# Patient Record
Sex: Female | Born: 1990
Health system: Southern US, Community
[De-identification: ages and names within clinical notes are randomized; demographics above are authoritative.]

## PROBLEM LIST (undated history)

## (undated) DIAGNOSIS — R569 Unspecified convulsions: Secondary | ICD-10-CM

## (undated) DIAGNOSIS — E039 Hypothyroidism, unspecified: Secondary | ICD-10-CM

## (undated) DIAGNOSIS — D271 Benign neoplasm of left ovary: Secondary | ICD-10-CM

## (undated) DIAGNOSIS — R51 Headache: Secondary | ICD-10-CM

---

## 2014-09-24 LAB — OB RESULTS CONSOLE ANTIBODY SCREEN: ANTIBODY SCREEN: NEGATIVE

## 2014-09-24 LAB — OB RESULTS CONSOLE ABO/RH: RH TYPE: POSITIVE

## 2014-09-24 LAB — OB RESULTS CONSOLE RUBELLA ANTIBODY, IGM: RUBELLA: IMMUNE

## 2014-09-24 LAB — OB RESULTS CONSOLE HIV ANTIBODY (ROUTINE TESTING): HIV: NONREACTIVE

## 2014-09-24 LAB — OB RESULTS CONSOLE HEPATITIS B SURFACE ANTIGEN: Hepatitis B Surface Ag: NEGATIVE

## 2014-09-24 LAB — OB RESULTS CONSOLE RPR: RPR: NONREACTIVE

## 2014-09-24 LAB — OB RESULTS CONSOLE GC/CHLAMYDIA
CHLAMYDIA, DNA PROBE: NEGATIVE
Gonorrhea: NEGATIVE

## 2014-11-28 NOTE — L&D Delivery Note (Signed)
Delivery Note At 11:14 PM a viable and healthy female was delivered via Vaginal, Spontaneous Delivery (Presentation: Right Occiput Anterior).  APGAR: ,9 and 9 ; weight  .-pending Placenta status: complete, spontaneous, .  Cord: 3 vessels with the following complications: None.  Cord pH: n/a  Anesthesia: Epidural  Episiotomy: None Lacerations: 2nd degree;Perineal Suture Repair: 3.0 vicryl rapide Est. Blood Loss (mL):  350 cc  Mom to postpartum.  Baby to Couplet care / Skin to Skin.  Neelam Tiggs R 04/13/2015, 11:37 PM

## 2014-12-13 ENCOUNTER — Inpatient Hospital Stay (HOSPITAL_COMMUNITY): Admission: AD | Admit: 2014-12-13 | Payer: Self-pay | Source: Ambulatory Visit | Admitting: Obstetrics & Gynecology

## 2015-04-13 ENCOUNTER — Encounter (HOSPITAL_COMMUNITY): Payer: Self-pay | Admitting: *Deleted

## 2015-04-13 ENCOUNTER — Inpatient Hospital Stay (HOSPITAL_COMMUNITY)
Admission: AD | Admit: 2015-04-13 | Discharge: 2015-04-15 | DRG: 775 | Disposition: A | Discharge status: Home / Self Care | Payer: 59 | Source: Ambulatory Visit | Attending: Obstetrics & Gynecology | Admitting: Obstetrics & Gynecology

## 2015-04-13 ENCOUNTER — Inpatient Hospital Stay (HOSPITAL_COMMUNITY): Payer: 59 | Admitting: Anesthesiology

## 2015-04-13 DIAGNOSIS — Z3A37 37 weeks gestation of pregnancy: Secondary | ICD-10-CM | POA: Diagnosis present

## 2015-04-13 DIAGNOSIS — O99824 Streptococcus B carrier state complicating childbirth: Secondary | ICD-10-CM | POA: Diagnosis present

## 2015-04-13 DIAGNOSIS — IMO0001 Reserved for inherently not codable concepts without codable children: Secondary | ICD-10-CM

## 2015-04-13 DIAGNOSIS — Z3403 Encounter for supervision of normal first pregnancy, third trimester: Secondary | ICD-10-CM | POA: Diagnosis present

## 2015-04-13 HISTORY — DX: Unspecified convulsions: R56.9

## 2015-04-13 LAB — ABO/RH: ABO/RH(D): O POS

## 2015-04-13 LAB — CBC
HCT: 38.7 % (ref 36.0–46.0)
Hemoglobin: 13.4 g/dL (ref 12.0–15.0)
MCH: 31.2 pg (ref 26.0–34.0)
MCHC: 34.6 g/dL (ref 30.0–36.0)
MCV: 90 fL (ref 78.0–100.0)
PLATELETS: 201 10*3/uL (ref 150–400)
RBC: 4.3 MIL/uL (ref 3.87–5.11)
RDW: 13.7 % (ref 11.5–15.5)
WBC: 13.3 10*3/uL — ABNORMAL HIGH (ref 4.0–10.5)

## 2015-04-13 LAB — OB RESULTS CONSOLE GBS: GBS: POSITIVE

## 2015-04-13 LAB — TYPE AND SCREEN
ABO/RH(D): O POS
ANTIBODY SCREEN: NEGATIVE

## 2015-04-13 MED ORDER — OXYCODONE-ACETAMINOPHEN 5-325 MG PO TABS
2.0000 | ORAL_TABLET | ORAL | Status: DC | PRN
Start: 2015-04-13 — End: 2015-04-14

## 2015-04-13 MED ORDER — OXYTOCIN 40 UNITS IN LACTATED RINGERS INFUSION - SIMPLE MED
62.5000 mL/h | INTRAVENOUS | Status: DC
  Administered 2015-04-13: 62.5 mL/h via INTRAVENOUS
  Filled 2015-04-13: qty 1000

## 2015-04-13 MED ORDER — ACETAMINOPHEN 325 MG PO TABS
650.0000 mg | ORAL_TABLET | ORAL | Status: DC | PRN
Start: 2015-04-13 — End: 2015-04-14

## 2015-04-13 MED ORDER — LIDOCAINE HCL (PF) 1 % IJ SOLN
30.0000 mL | INTRAMUSCULAR | Status: DC | PRN
  Filled 2015-04-13: qty 30

## 2015-04-13 MED ORDER — ONDANSETRON HCL 4 MG/2ML IJ SOLN: 4.0000 mg | Freq: Four times a day (QID) | INTRAMUSCULAR | Status: DC | PRN

## 2015-04-13 MED ORDER — LIDOCAINE-EPINEPHRINE (PF) 2 %-1:200000 IJ SOLN
INTRAMUSCULAR | Status: DC | PRN
  Administered 2015-04-13: 3 mL

## 2015-04-13 MED ORDER — LACTATED RINGERS IV SOLN: 500.0000 mL | INTRAVENOUS | Status: DC | PRN

## 2015-04-13 MED ORDER — CITRIC ACID-SODIUM CITRATE 334-500 MG/5ML PO SOLN: 30.0000 mL | ORAL | Status: DC | PRN

## 2015-04-13 MED ORDER — DIPHENHYDRAMINE HCL 50 MG/ML IJ SOLN: 12.5000 mg | INTRAMUSCULAR | Status: DC | PRN

## 2015-04-13 MED ORDER — FENTANYL 2.5 MCG/ML BUPIVACAINE 1/10 % EPIDURAL INFUSION (WH - ANES)
14.0000 mL/h | INTRAMUSCULAR | Status: DC | PRN
  Administered 2015-04-13: 10 mL/h via EPIDURAL
  Filled 2015-04-13: qty 125

## 2015-04-13 MED ORDER — EPHEDRINE 5 MG/ML INJ
10.0000 mg | INTRAVENOUS | Status: DC | PRN
  Filled 2015-04-13: qty 2

## 2015-04-13 MED ORDER — SODIUM BICARBONATE 8.4 % IV SOLN
INTRAVENOUS | Status: DC | PRN
  Administered 2015-04-13 (×2): 4 mL via EPIDURAL

## 2015-04-13 MED ORDER — OXYCODONE-ACETAMINOPHEN 5-325 MG PO TABS
1.0000 | ORAL_TABLET | ORAL | Status: DC | PRN
  Administered 2015-04-14: 1 via ORAL
  Filled 2015-04-13: qty 1

## 2015-04-13 MED ORDER — OXYTOCIN BOLUS FROM INFUSION: 500.0000 mL | INTRAVENOUS | Status: DC

## 2015-04-13 MED ORDER — SODIUM CHLORIDE 0.9 % IV SOLN
2.0000 g | Freq: Once | INTRAVENOUS | Status: AC
  Administered 2015-04-13: 2 g via INTRAVENOUS
  Filled 2015-04-13: qty 2000

## 2015-04-13 MED ORDER — LACTATED RINGERS IV SOLN
INTRAVENOUS | Status: DC
  Administered 2015-04-13: 19:00:00 via INTRAVENOUS

## 2015-04-13 MED ORDER — PHENYLEPHRINE 40 MCG/ML (10ML) SYRINGE FOR IV PUSH (FOR BLOOD PRESSURE SUPPORT)
80.0000 ug | PREFILLED_SYRINGE | INTRAVENOUS | Status: DC | PRN
  Filled 2015-04-13: qty 2
  Filled 2015-04-13: qty 20

## 2015-04-13 NOTE — H&P (Addendum)
Robin Griffin is a 24 y.o. female presenting at 55 wks in active labor. UCs with pain since 5 pm but noted slight blood spotty discharge this morning. Exam closed/long cervix 2 days back in office. Denies bleeding/ loss of fluid. Good FMs.   G1, well dated by 1st trim sono. [facility]/ Dr Benjie Karvonen.  Healthy, h/o childhood seizures but no medication since years. SCH'hage in 1st trim 5.5 cm, resolved. GBS(+), rest was uncomplicated pregnancy. Last sono 2 days back- Vtx, 6'8" at 67%, nl AFI.   History OB History    Gravida Para Term Preterm AB TAB SAB Ectopic Multiple Living   1              Past Medical History  Diagnosis Date  . Seizures    History reviewed. No pertinent past surgical history. Family History: family history is not on file. Social History:  has no tobacco, alcohol, and drug history on file.   Prenatal Transfer Tool  Maternal Diabetes: No Genetic Screening: Normal Ultrascreen normal (1:10,000) and AFP1 normal.  Maternal Ultrasounds/Referrals: Normal anatomy. 1st trim subchorionic hemorrhage resolved at 14 wks.  Fetal Ultrasounds or other Referrals:  None Maternal Substance Abuse:  No Significant Maternal Medications:  Zantac Significant Maternal Lab Results:  Lab values include: Group B Strep positive Other Comments:  None  ROS neg   Dilation: 10 Effacement (%): 100 Station: +1, +2 Exam by:: Caerpenter RNC Blood pressure 125/84, pulse 98, temperature 98.2 F (36.8 C), temperature source Oral, resp. rate 18, height 5\' 3"  (1.6 m), weight 131 lb (59.421 kg), SpO2 99 %. Exam Physical Exam  A&O x 3, no acute distress. Pleasant HEENT neg, no thyromegaly Lungs CTA bilat CV RRR, S1S2 normal Abdo soft, non tender, non acute Extr no edema/ tenderness Pelvic see above per RN FHT  140s, + accels/ no decels/ mod variability- category I Toco regular 3 minutes.   Prenatal labs: ABO, Rh: O/Positive/-- (10/28 0000) Antibody: Negative (10/28 0000) Rubella:  Immune (10/28 0000) RPR: Nonreactive (10/28 0000)  HBsAg: Negative (10/28 0000)  HIV: Non-reactive (10/28 0000)  GBS: Positive (05/16 0000)   Assessment/Plan: 24 yo, G1 at 37 wks in active labor. GBS(+), Ampicillin started in MAU. FHT- category I. Anticipate SVD (6.1/2 lbs), desired epidural, ok if not delivered.   Evonda Enge R 04/13/2015, 7:42 PM   Addendum-  Possible SROM since 9 am on 04/12/15. V.Abbeygail Igoe, MD

## 2015-04-13 NOTE — MAU Note (Signed)
Ctx at 2am . Thinks she had some bleeding and leaking of fluid

## 2015-04-13 NOTE — Anesthesia Procedure Notes (Signed)
Epidural Patient location during procedure: OB  Staffing Anesthesiologist: Avanti Jetter, CHRIS Performed by: anesthesiologist   Preanesthetic Checklist Completed: patient identified, surgical consent, pre-op evaluation, timeout performed, IV checked, risks and benefits discussed and monitors and equipment checked  Epidural Patient position: sitting Prep: site prepped and draped and DuraPrep Patient monitoring: heart rate, cardiac monitor, continuous pulse ox and blood pressure Approach: midline Location: L3-L4 Injection technique: LOR saline  Needle:  Needle type: Tuohy  Needle gauge: 17 G Needle length: 9 cm Needle insertion depth: 4 cm Catheter type: closed end flexible Catheter size: 19 Gauge Catheter at skin depth: 12 cm Test dose: negative and 2% lidocaine with Epi 1:200 K  Assessment Events: blood not aspirated, injection not painful, no injection resistance, negative IV test and no paresthesia  Additional Notes H+P and labs checked, risks and benefits discussed with the patient, consent obtained, procedure tolerated well and without complications.  Reason for block:procedure for pain

## 2015-04-13 NOTE — Anesthesia Preprocedure Evaluation (Signed)
Anesthesia Evaluation  Patient identified by MRN, date of birth, ID band Patient awake    Reviewed: Allergy & Precautions, NPO status , Patient's Chart, lab work & pertinent test results  History of Anesthesia Complications Negative for: history of anesthetic complications  Airway Mallampati: I  TM Distance: >3 FB Neck ROM: Full    Dental  (+) Teeth Intact   Pulmonary neg pulmonary ROS,  breath sounds clear to auscultation        Cardiovascular Rhythm:Regular     Neuro/Psych negative neurological ROS     GI/Hepatic negative GI ROS, Neg liver ROS,   Endo/Other  negative endocrine ROS  Renal/GU negative Renal ROS     Musculoskeletal   Abdominal   Peds  Hematology negative hematology ROS (+)   Anesthesia Other Findings   Reproductive/Obstetrics (+) Pregnancy                             Anesthesia Physical Anesthesia Plan  ASA: II  Anesthesia Plan: Epidural   Post-op Pain Management:    Induction:   Airway Management Planned:   Additional Equipment:   Intra-op Plan:   Post-operative Plan:   Informed Consent: I have reviewed the patients History and Physical, chart, labs and discussed the procedure including the risks, benefits and alternatives for the proposed anesthesia with the patient or authorized representative who has indicated his/her understanding and acceptance.     Plan Discussed with: Anesthesiologist  Anesthesia Plan Comments:         Anesthesia Quick Evaluation

## 2015-04-14 ENCOUNTER — Encounter (HOSPITAL_COMMUNITY): Payer: Self-pay | Admitting: *Deleted

## 2015-04-14 LAB — CBC
HCT: 35.9 % — ABNORMAL LOW (ref 36.0–46.0)
Hemoglobin: 12.3 g/dL (ref 12.0–15.0)
MCH: 30.6 pg (ref 26.0–34.0)
MCHC: 34.3 g/dL (ref 30.0–36.0)
MCV: 89.3 fL (ref 78.0–100.0)
Platelets: 216 10*3/uL (ref 150–400)
RBC: 4.02 MIL/uL (ref 3.87–5.11)
RDW: 13.5 % (ref 11.5–15.5)
WBC: 19.1 10*3/uL — ABNORMAL HIGH (ref 4.0–10.5)

## 2015-04-14 LAB — HIV ANTIBODY (ROUTINE TESTING W REFLEX): HIV Screen 4th Generation wRfx: NONREACTIVE

## 2015-04-14 LAB — RPR: RPR Ser Ql: NONREACTIVE

## 2015-04-14 MED ORDER — LANOLIN HYDROUS EX OINT: TOPICAL_OINTMENT | CUTANEOUS | Status: DC | PRN

## 2015-04-14 MED ORDER — BENZOCAINE-MENTHOL 20-0.5 % EX AERO
1.0000 "application " | INHALATION_SPRAY | CUTANEOUS | Status: DC | PRN
  Administered 2015-04-14: 1 via TOPICAL
  Filled 2015-04-14: qty 56

## 2015-04-14 MED ORDER — TETANUS-DIPHTH-ACELL PERTUSSIS 5-2.5-18.5 LF-MCG/0.5 IM SUSP: 0.5000 mL | Freq: Once | INTRAMUSCULAR | Status: DC

## 2015-04-14 MED ORDER — ONDANSETRON HCL 4 MG PO TABS: 4.0000 mg | ORAL_TABLET | ORAL | Status: DC | PRN

## 2015-04-14 MED ORDER — ONDANSETRON HCL 4 MG/2ML IJ SOLN: 4.0000 mg | INTRAMUSCULAR | Status: DC | PRN

## 2015-04-14 MED ORDER — ACETAMINOPHEN 325 MG PO TABS: 650.0000 mg | ORAL_TABLET | ORAL | Status: DC | PRN

## 2015-04-14 MED ORDER — DIPHENHYDRAMINE HCL 25 MG PO CAPS: 25.0000 mg | ORAL_CAPSULE | Freq: Four times a day (QID) | ORAL | Status: DC | PRN

## 2015-04-14 MED ORDER — SENNOSIDES-DOCUSATE SODIUM 8.6-50 MG PO TABS
2.0000 | ORAL_TABLET | ORAL | Status: DC
  Administered 2015-04-14 (×2): 2 via ORAL
  Filled 2015-04-14 (×2): qty 2

## 2015-04-14 MED ORDER — WITCH HAZEL-GLYCERIN EX PADS
1.0000 "application " | MEDICATED_PAD | CUTANEOUS | Status: DC | PRN
  Administered 2015-04-14: 1 via TOPICAL

## 2015-04-14 MED ORDER — DIBUCAINE 1 % RE OINT
1.0000 "application " | TOPICAL_OINTMENT | RECTAL | Status: DC | PRN
  Administered 2015-04-14: 1 via RECTAL
  Filled 2015-04-14: qty 28

## 2015-04-14 MED ORDER — PRENATAL MULTIVITAMIN CH
1.0000 | ORAL_TABLET | Freq: Every day | ORAL | Status: DC
  Administered 2015-04-14 – 2015-04-15 (×2): 1 via ORAL
  Filled 2015-04-14 (×2): qty 1

## 2015-04-14 MED ORDER — ZOLPIDEM TARTRATE 5 MG PO TABS: 5.0000 mg | ORAL_TABLET | Freq: Every evening | ORAL | Status: DC | PRN

## 2015-04-14 MED ORDER — OXYCODONE-ACETAMINOPHEN 5-325 MG PO TABS
1.0000 | ORAL_TABLET | ORAL | Status: DC | PRN
  Administered 2015-04-14: 1 via ORAL
  Filled 2015-04-14: qty 1

## 2015-04-14 MED ORDER — SIMETHICONE 80 MG PO CHEW: 80.0000 mg | CHEWABLE_TABLET | ORAL | Status: DC | PRN

## 2015-04-14 MED ORDER — IBUPROFEN 600 MG PO TABS
600.0000 mg | ORAL_TABLET | Freq: Four times a day (QID) | ORAL | Status: DC
  Administered 2015-04-14 – 2015-04-15 (×6): 600 mg via ORAL
  Filled 2015-04-14 (×6): qty 1

## 2015-04-14 NOTE — Progress Notes (Signed)
PPD 1 SVD  S:  Reports feeling ok but a little tired             Tolerating po/ No nausea or vomiting             Bleeding is light             Pain controlled with motrin             Up ad lib / ambulatory / voiding QS  Newborn bottle-feeding / may also pump some for breast-feeding  / female  O:               VS: BP 113/75 mmHg  Pulse 105  Temp(Src) 98 F (36.7 C) (Oral)  Resp 20  Ht 5\' 3"  (1.6 m)  Wt 59.421 kg (131 lb)  BMI 23.21 kg/m2  SpO2 99%  Breastfeeding? Unknown   LABS:              Recent Labs  04/13/15 1855 04/14/15 0550  WBC 13.3* 19.1*  HGB 13.4 12.3  PLT 201 216               Blood type: --/--/O POS, O POS (05/16 1855)  Rubella: Immune (10/28 0000)                   tdap current               I&O: Intake/Output      05/16 0701 - 05/17 0700 05/17 0701 - 05/18 0700   Blood 350    Total Output 350     Net -350                      Physical Exam:             Alert and oriented X3  Abdomen: soft, non-tender, non-distended              Fundus: firm, non-tender, U-1  Perineum: mild edema / ice pack in place  Lochia: moderate  Extremities: no edema, no calf pain or tenderness   A: PPD # 1 SVD with 2nd degree repair  Doing well - stable status  P: Routine post partum orders  Anticipate DC home tomorrow             Encouraged to work with lactation today if desires to breastfeed / discus feeding plan with pediatrician this am on rounds - agrees  Artelia Laroche CNM, MSN, [facility] 04/14/2015, 8:42 AM

## 2015-04-14 NOTE — Anesthesia Postprocedure Evaluation (Signed)
Anesthesia Post Note  Patient: Robin Griffin  Procedure(s) Performed: * No procedures listed *  Anesthesia type: Epidural  Patient location: Mother/Baby  Post pain: Pain level controlled  Post assessment: Post-op Vital signs reviewed  Last Vitals:  Filed Vitals:   04/14/15 0900  BP: 97/61  Pulse: 99  Temp: 36.7 C  Resp: 16    Post vital signs: Reviewed  Level of consciousness:alert  Complications: No apparent anesthesia complications

## 2015-04-14 NOTE — Lactation Note (Signed)
This note was copied from the chart of Robin Alexah Kivett. Lactation Consultation Note  P1, 37 weeks.  Baby sleepy at the breast.  Has not sustained a latch. Attempted latching in all positions.  Mother screamed out in pain with shallow latch. Encouraged baby to open wider to achieve a deeper latch.  Introduced #20NS. Mother's pain is not improved with NS. Reviewed hand expression, prepumping before latching. Mother had the most success with side lying, a few sucks noted but no swallows. Plan is for baby to breastfeed if possible then mother will post pump 4-6 times a day for 15-20 min. Give baby back volume pumped.  Reviewed milk storage, cleaning and how to use 5 fr with syringe for feedings. Assisted with FOB to feed baby 7 ml of hydrolyzed formula. Reviewed burping, cluster feeding and waking baby to feed every 3 hours. Mom made aware of O/P services, breastfeeding support groups, community resources, and our phone # for post-discharge questions.    Patient Name: Robin Griffin [id number] Date: 04/14/2015 Reason for consult: Initial assessment   Maternal Data    Feeding Feeding Type: Breast Fed Length of feed: 10 min (off and on)  LATCH Score/Interventions Latch: Repeated attempts needed to sustain latch, nipple held in mouth throughout feeding, stimulation needed to elicit sucking reflex. Intervention(s): Skin to skin;Waking techniques Intervention(s): Adjust position;Assist with latch;Breast massage  Audible Swallowing: None Intervention(s): Skin to skin;Hand expression  Type of Nipple: Everted at rest and after stimulation  Comfort (Breast/Nipple): Filling, red/small blisters or bruises, mild/mod discomfort  Problem noted: Mild/Moderate discomfort Interventions (Mild/moderate discomfort): Hand massage;Hand expression;Post-pump  Hold (Positioning): Assistance needed to correctly position infant at breast and maintain latch.  LATCH Score: 5  Lactation Tools  Discussed/Used     Consult Status Consult Status: Follow-up Date: 04/15/15 Follow-up type: In-patient    Vivianne Master [facility] 04/14/2015, 2:31 PM

## 2015-04-15 LAB — CBC
HCT: 32.6 % — ABNORMAL LOW (ref 36.0–46.0)
Hemoglobin: 11 g/dL — ABNORMAL LOW (ref 12.0–15.0)
MCH: 30.6 pg (ref 26.0–34.0)
MCHC: 33.7 g/dL (ref 30.0–36.0)
MCV: 90.8 fL (ref 78.0–100.0)
PLATELETS: 177 10*3/uL (ref 150–400)
RBC: 3.59 MIL/uL — AB (ref 3.87–5.11)
RDW: 13.9 % (ref 11.5–15.5)
WBC: 12.4 10*3/uL — ABNORMAL HIGH (ref 4.0–10.5)

## 2015-04-15 MED ORDER — OXYCODONE-ACETAMINOPHEN 5-325 MG PO TABS: 1.0000 | ORAL_TABLET | ORAL | Status: DC | PRN

## 2015-04-15 MED ORDER — IBUPROFEN 600 MG PO TABS: 600.0000 mg | ORAL_TABLET | Freq: Four times a day (QID) | ORAL | Status: DC

## 2015-04-15 NOTE — Lactation Note (Signed)
This note was copied from the chart of Robin Karishma Unrein. Lactation Consultation Note  38 week old baby. Baby being put on double phototherapy. Observed mother attempting to latch in football hold but baby too sleepy. Mother states during the night baby was more active at the breast. After 10 min of nuzzling, suggest FOB give baby formula supplementation with 5 fr and finger. Baby took 12 ml. Mother has not been pumped today. Mother massaged and hand expressed a few small drops. Assisted with DEBP encouraged her to pump at least 3 more times today to help establish her milk supply. Parents aware of volume guidelines. Suggest outpt appt after discharge.  FOB will check his schedule.  Discussed possible renting a DEBP. Parents have breast pump prescription.  Encouraged them to call their insurance company.   Patient Name: Robin Griffin [id number] Date: 04/15/2015 Reason for consult: Follow-up assessment   Maternal Data    Feeding Feeding Type: Formula  LATCH Score/Interventions Latch: Too sleepy or reluctant, no latch achieved, no sucking elicited. Intervention(s): Waking techniques;Skin to skin Intervention(s): Breast massage;Assist with latch;Adjust position  Audible Swallowing: None  Type of Nipple: Everted at rest and after stimulation  Comfort (Breast/Nipple): Filling, red/small blisters or bruises, mild/mod discomfort  Problem noted: Mild/Moderate discomfort Interventions (Mild/moderate discomfort): Hand expression;Comfort gels;Post-pump  Hold (Positioning): No assistance needed to correctly position infant at breast.  LATCH Score: 5  Lactation Tools Discussed/Used Pump Review: Setup, frequency, and cleaning;Milk Storage Initiated by:: Vivianne Master RN Date initiated:: 04/14/15   Consult Status      Robin Griffin Robin Griffin 04/15/2015, 12:37 PM

## 2015-04-15 NOTE — Discharge Summary (Signed)
Obstetric Discharge Summary Reason for Admission: onset of labor and rupture of membranes Prenatal Procedures: ultrasound Intrapartum Procedures: spontaneous vaginal delivery Postpartum Procedures: none Complications-Operative and Postpartum: 2nd degree perineal laceration HEMOGLOBIN  Date Value Ref Range Status  04/15/2015 11.0* 12.0 - 15.0 g/dL Final   HCT  Date Value Ref Range Status  04/15/2015 32.6* 36.0 - 46.0 % Final    Physical Exam:  General: alert, cooperative and no distress Lochia: appropriate Uterine Fundus: firm, midline, U-1 Perineum: healing well, no significant drainage, no dehiscence, no significant erythema DVT Evaluation: No evidence of DVT seen on physical exam. Negative Homan's sign. No cords or calf tenderness. No significant calf/ankle edema.  Discharge Diagnoses: Term Pregnancy-delivered  Discharge Information: Date: 04/15/2015 Activity: pelvic rest Diet: routine Medications: PNV and Ibuprofen Condition: stable Instructions: refer to practice specific booklet Discharge to: home Follow-up Information    Follow up with MODY,VAISHALI R, MD. Schedule an appointment as soon as possible for a visit in 6 weeks.   Specialty:  Obstetrics and Gynecology   Why:  postpartum visit   Contact information:   [place] [address] [phone]       Newborn Data: Live born female on 04/13/2017 Birth Weight: 6 lb 10.2 oz (3010 g) APGAR: 8, 9  Home with mother.  Laury Deep, M MSN, CNM 04/15/2015, 12:45 PM

## 2015-04-15 NOTE — Discharge Instructions (Signed)
Breast Pumping Tips °If you are breastfeeding, there may be times when you cannot feed your baby directly. Returning to work or going on a trip are common examples. Pumping allows you to store breast milk and feed it to your baby later.  °You may not get much milk when you first start to pump. Your breasts should start to make more after a few days. If you pump at the times you usually feed your baby, you may be able to keep making enough milk to feed your baby without also using formula. The more often you pump, the more milk you will produce.  °WHEN SHOULD I PUMP?  °· You can begin to pump soon after delivery. However, some experts recommend waiting about 4 weeks before giving your infant a bottle to make sure breastfeeding is going well.  °· If you plan to return to work, begin pumping a few weeks before. This will help you develop techniques that work best for you. It also lets you build up a supply of breast milk.   °· When you are with your infant, feed on demand and pump after each feeding.   °· When you are away from your infant for several hours, pump for about 15 minutes every 2-3 hours. Pump both breasts at the same time if you can.   °· If your infant has a formula feeding, make sure to pump around the same time.     °· If you drink any alcohol, wait 2 hours before pumping.   °HOW DO I PREPARE TO PUMP? °Your let-down reflex is the natural reaction to stimulation that makes your breast milk flow. It is easier to stimulate this reflex when you are relaxed. Find relaxation techniques that work for you. If you have difficulty with your let-down reflex, try these methods:  °· Smell one of your infant's blankets or an item of clothing.   °· Look at a picture or video of your infant.   °· Sit in a quiet, private space.   °· Massage the breast you plan to pump.   °· Place soothing warmth on the breast.   °· Play relaxing music.   °WHAT ARE SOME GENERAL BREAST PUMPING TIPS? °· Wash your hands before you pump. You  do not need to wash your nipples or breasts. °· There are three ways to pump. °¨ You can use your hand to massage and compress your breast. °¨ You can use a handheld manual pump. °¨ You can use an electric pump.   °· Make sure the suction cup (flange) on the breast pump is the right size. Place the flange directly over the nipple. If it is the wrong size or placed the wrong way, it may be painful and cause nipple damage.   °· If pumping is uncomfortable, apply a small amount of purified or modified lanolin to your nipple and areola. °· If you are using an electric pump, adjust the speed and suction power to be more comfortable. °· If pumping is painful or if you are not getting very much milk, you may need a different type of pump. A lactation consultant can help you determine what type of pump to use.   °· Keep a full water bottle near you at all times. Drinking lots of fluid helps you make more milk.  °· You can store your milk to use later. Pumped breast milk can be stored in a sealable, sterile container or plastic bag. Label all stored breast milk with the date you pumped it. °¨ Milk can stay out at room temperature for up to 8 hours. °¨   You can store your milk in the refrigerator for up to 8 days. °¨ You can store your milk in the freezer for 3 months. Thaw frozen milk using warm water. Do not put it in the microwave. °· Do not smoke. Smoking can lower your milk supply and harm your infant. If you need help quitting, ask your health care provider to recommend a program.   °WHEN SHOULD I CALL MY HEALTH CARE PROVIDER OR A LACTATION CONSULTANT? °· You are having trouble pumping. °· You are concerned that you are not making enough milk. °· You have nipple pain, soreness, or redness. °· You want to use birth control. Birth control pills may lower your milk supply. Talk to your health care provider about your options. °Document Released: 05/04/2010 Document Revised: 11/19/2013 Document Reviewed:  09/06/2013 °[employer]® Patient Information ©2015 [employer]. This information is not intended to replace advice given to you by your health care provider. Make sure you discuss any questions you have with your health care provider. ° °Nutrition for the New Mother  °A new mother needs good health and nutrition so she can have energy to take care of a new baby. Whether a mother breastfeeds or formula feeds the baby, it is important to have a well-balanced diet. Foods from all the food groups should be chosen to meet the new mother's energy needs and to give her the nutrients needed for repair and healing.  °A HEALTHY EATING PLAN °The My Pyramid plan for Moms outlines what you should eat to help you and your baby stay healthy. The energy and amount of food you need depends on whether or not you are breastfeeding. If you are breastfeeding you will need more nutrients. If you choose not to breastfeed, your nutrition goal should be to return to a healthy weight. Limiting calories may be needed if you are not breastfeeding.  °HOME CARE INSTRUCTIONS  °· For a personal plan based on your unique needs, see your Registered Dietitian or visit www.mypyramid.gov. °· Eat a variety of foods. The plan below will help guide you. The following chart has a suggested daily meal plan from the My Pyramid for Moms. °· Eat a variety of fruits and vegetables. °· Eat more dark green and orange vegetables and cooked dried beans. °· Make half your grains whole grains. Choose whole instead of refined grains. °· Choose low-fat or lean meats and poultry. °· Choose low-fat or fat-free dairy products like milk, cheese, or yogurt. °Fruits °· Breastfeeding: 2 cups °· Non-Breastfeeding: 2 cups °· What Counts as a serving? °¨ 1 cup of fruit or juice. °¨ ½ cup dried fruit. °Vegetables °· Breastfeeding: 3 cups °· Non-Breastfeeding: 2 ½ cups °· What Counts as a serving? °¨ 1 cup raw or cooked vegetables. °¨ Juice or 2 cups raw leafy  vegetables. °Grains °· Breastfeeding: 8 oz °· Non-Breastfeeding: 6 oz °· What Counts as a serving? °¨ 1 slice bread. °¨ 1 oz ready-to-eat cereal. °¨ ½ cup cooked pasta, rice, or cereal. °Meat and Beans °· Breastfeeding: 6 ½ oz °· Non-Breastfeeding: 5 ½ oz °· What Counts as a serving? °¨ 1 oz lean meat, poultry, or fish °¨ ¼ cup cooked dry beans °¨ ½ oz nuts or 1 egg °¨ 1 tbs peanut butter °Milk °· Breastfeeding: 3 cups °· Non-Breastfeeding: 3 cups °· What Counts as a serving? °¨ 1 cup milk. °¨ 8 oz yogurt. °¨ 1 ½ oz cheese. °¨ 2 oz processed cheese. °TIPS FOR THE BREASTFEEDING MOM °· Rapid weight   loss is not suggested when you are breastfeeding. By simply breastfeeding, you will be able to lose the weight gained during your pregnancy. Your caregiver can keep track of your weight and tell you if your weight loss is appropriate.  Be sure to drink fluids. You may notice that you are thirstier than usual. A suggestion is to drink a glass of water or other beverage whenever you breastfeed.  Avoid alcohol as it can be passed into your breast milk.  Limit caffeine drinks to no more than 2 to 3 cups per day.  You may need to keep taking your prenatal vitamin while you are breastfeeding. Talk with your caregiver about taking a vitamin or supplement. RETURING TO A HEALTHY WEIGHT  The My Pyramid Plan for Moms will help you return to a healthy weight. It will also provide the nutrients you need.  You may need to limit "empty" calories. These include:  High fat foods like fried foods, fatty meats, fast food, butter, and mayonnaise.  High sugar foods like sodas, jelly, candy, and sweets.  Be physically active. Include 30 minutes of exercise or more each day. Choose an activity you like such as walking, swimming, biking, or aerobics. Check with your caregiver before you start to exercise. Document Released: 02/21/2008 Document Revised: 02/06/2012 Document Reviewed: 02/21/2008 [facility] Patient Information  2015 [place], [place]. This information is not intended to replace advice given to you by your health care provider. Make sure you discuss any questions you have with your health care provider. Postpartum Depression and Baby Blues The postpartum period begins right after the birth of a baby. During this time, there is often a great amount of joy and excitement. It is also a time of many changes in the life of the parents. Regardless of how many times a mother gives birth, each child brings new challenges and dynamics to the family. It is not unusual to have feelings of excitement along with confusing shifts in moods, emotions, and thoughts. All mothers are at risk of developing postpartum depression or the "baby blues." These mood changes can occur right after giving birth, or they may occur many months after giving birth. The baby blues or postpartum depression can be mild or severe. Additionally, postpartum depression can go away rather quickly, or it can be a long-term condition.  CAUSES Raised hormone levels and the rapid drop in those levels are thought to be a main cause of postpartum depression and the baby blues. A number of hormones change during and after pregnancy. Estrogen and progesterone usually decrease right after the delivery of your baby. The levels of thyroid hormone and various cortisol steroids also rapidly drop. Other factors that play a role in these mood changes include major life events and genetics.  RISK FACTORS If you have any of the following risks for the baby blues or postpartum depression, know what symptoms to watch out for during the postpartum period. Risk factors that may increase the likelihood of getting the baby blues or postpartum depression include:  Having a personal or family history of depression.   Having depression while being pregnant.   Having premenstrual mood issues or mood issues related to oral contraceptives.  Having a lot of life stress.   Having  marital conflict.   Lacking a social support network.   Having a baby with special needs.   Having health problems, such as diabetes.  SIGNS AND SYMPTOMS Symptoms of baby blues include:  Brief changes in mood, such as going  from extreme happiness to sadness.  Decreased concentration.   Difficulty sleeping.   Crying spells, tearfulness.   Irritability.   Anxiety.  Symptoms of postpartum depression typically begin within the first month after giving birth. These symptoms include:  Difficulty sleeping or excessive sleepiness.   Marked weight loss.   Agitation.   Feelings of worthlessness.   Lack of interest in activity or food.  Postpartum psychosis is a very serious condition and can be dangerous. Fortunately, it is rare. Displaying any of the following symptoms is cause for immediate medical attention. Symptoms of postpartum psychosis include:   Hallucinations and delusions.   Bizarre or disorganized behavior.   Confusion or disorientation.  DIAGNOSIS  A diagnosis is made by an evaluation of your symptoms. There are no medical or lab tests that lead to a diagnosis, but there are various questionnaires that a health care provider may use to identify those with the baby blues, postpartum depression, or psychosis. Often, a screening tool called the [place] Postnatal Depression Scale is used to diagnose depression in the postpartum period.  TREATMENT The baby blues usually goes away on its own in 1-2 weeks. Social support is often all that is needed. You will be encouraged to get adequate sleep and rest. Occasionally, you may be given medicines to help you sleep.  Postpartum depression requires treatment because it can last several months or longer if it is not treated. Treatment may include individual or group therapy, medicine, or both to address any social, physiological, and psychological factors that may play a role in the depression. Regular exercise, a  healthy diet, rest, and social support may also be strongly recommended.  Postpartum psychosis is more serious and needs treatment right away. Hospitalization is often needed. HOME CARE INSTRUCTIONS  Get as much rest as you can. Nap when the baby sleeps.   Exercise regularly. Some women find yoga and walking to be beneficial.   Eat a balanced and nourishing diet.   Do little things that you enjoy. Have a cup of tea, take a bubble bath, read your favorite magazine, or listen to your favorite music.  Avoid alcohol.   Ask for help with household chores, cooking, grocery shopping, or running errands as needed. Do not try to do everything.   Talk to people close to you about how you are feeling. Get support from your partner, family members, friends, or other new moms.  Try to stay positive in how you think. Think about the things you are grateful for.   Do not spend a lot of time alone.   Only take over-the-counter or prescription medicine as directed by your health care provider.  Keep all your postpartum appointments.   Let your health care provider know if you have any concerns.  SEEK MEDICAL CARE IF: You are having a reaction to or problems with your medicine. SEEK IMMEDIATE MEDICAL CARE IF:  You have suicidal feelings.   You think you may harm the baby or someone else. MAKE SURE YOU:  Understand these instructions.  Will watch your condition.  Will get help right away if you are not doing well or get worse. Document Released: 08/18/2004 Document Revised: 11/19/2013 Document Reviewed: 08/26/2013 [facility] Patient Information 2015 [place], [place]. This information is not intended to replace advice given to you by your health care provider. Make sure you discuss any questions you have with your health care provider. Breastfeeding and Mastitis Mastitis is inflammation of the breast tissue. It can occur in women who  are breastfeeding. This can make breastfeeding  painful. Mastitis will sometimes go away on its own. Your health care provider will help determine if treatment is needed. CAUSES Mastitis is often associated with a blocked milk (lactiferous) duct. This can happen when too much milk builds up in the breast. Causes of excess milk in the breast can include:  Poor latch-on. If your baby is not latched onto the breast properly, she or he may not empty your breast completely while breastfeeding.  Allowing too much time to pass between feedings.  Wearing a bra or other clothing that is too tight. This puts extra pressure on the lactiferous ducts so milk does not flow through them as it should. Mastitis can also be caused by a bacterial infection. Bacteria may enter the breast tissue through cuts or openings in the skin. In women who are breastfeeding, this may occur because of cracked or irritated skin. Cracks in the skin are often caused when your baby does not latch on properly to the breast. SIGNS AND SYMPTOMS  Swelling, redness, tenderness, and pain in an area of the breast.  Swelling of the glands under the arm on the same side.  Fever may or may not accompany mastitis. If an infection is allowed to progress, a collection of pus (abscess) may develop. DIAGNOSIS  Your health care provider can usually diagnose mastitis based on your symptoms and a physical exam. Tests may be done to help confirm the diagnosis. These may include:  Removal of pus from the breast by applying pressure to the area. This pus can be examined in the lab to determine which bacteria are present. If an abscess has developed, the fluid in the abscess can be removed with a needle. This can also be used to confirm the diagnosis and determine the bacteria present. In most cases, pus will not be present.  Blood tests to determine if your body is fighting a bacterial infection.  Mammogram or ultrasound tests to rule out other problems or diseases. TREATMENT  Mastitis that  occurs with breastfeeding will sometimes go away on its own. Your health care provider may choose to wait 24 hours after first seeing you to decide whether a prescription medicine is needed. If your symptoms are worse after 24 hours, your health care provider will likely prescribe an antibiotic medicine to treat the mastitis. He or she will determine which bacteria are most likely causing the infection and will then select an appropriate antibiotic medicine. This is sometimes changed based on the results of tests performed to identify the bacteria, or if there is no response to the antibiotic medicine selected. Antibiotic medicines are usually given by mouth. You may also be given medicine for pain. HOME CARE INSTRUCTIONS  Only take over-the-counter or prescription medicines for pain, fever, or discomfort as directed by your health care provider.  If your health care provider prescribed an antibiotic medicine, take the medicine as directed. Make sure you finish it even if you start to feel better.  Do not wear a tight or underwire bra. Wear a soft, supportive bra.  Increase your fluid intake, especially if you have a fever.  Continue to empty the breast. Your health care provider can tell you whether this milk is safe for your infant or needs to be thrown out. You may be told to stop nursing until your health care provider thinks it is safe for your baby. Use a breast pump if you are advised to stop nursing.  Keep your nipples  clean and dry.  Empty the first breast completely before going to the other breast. If your baby is not emptying your breasts completely for some reason, use a breast pump to empty your breasts.  If you go back to work, pump your breasts while at work to stay in time with your nursing schedule.  Avoid allowing your breasts to become overly filled with milk (engorged). SEEK MEDICAL CARE IF:  You have pus-like discharge from the breast.  Your symptoms do not improve with  the treatment prescribed by your health care provider within 2 days. SEEK IMMEDIATE MEDICAL CARE IF:  Your pain and swelling are getting worse.  You have pain that is not controlled with medicine.  You have a red line extending from the breast toward your armpit.  You have a fever or persistent symptoms for more than 2-3 days.  You have a fever and your symptoms suddenly get worse. MAKE SURE YOU:   Understand these instructions.  Will watch your condition.  Will get help right away if you are not doing well or get worse. Document Released: 03/11/2005 Document Revised: 11/19/2013 Document Reviewed: 06/20/2013 [facility] Patient Information 2015 [place], [place]. This information is not intended to replace advice given to you by your health care provider. Make sure you discuss any questions you have with your health care provider. Breastfeeding Deciding to breastfeed is one of the best choices you can make for you and your baby. A change in hormones during pregnancy causes your breast tissue to grow and increases the number and size of your milk ducts. These hormones also allow proteins, sugars, and fats from your blood supply to make breast milk in your milk-producing glands. Hormones prevent breast milk from being released before your baby is born as well as prompt milk flow after birth. Once breastfeeding has begun, thoughts of your baby, as well as his or her sucking or crying, can stimulate the release of milk from your milk-producing glands.  BENEFITS OF BREASTFEEDING For Your Baby  Your first milk (colostrum) helps your baby's digestive system function better.   There are antibodies in your milk that help your baby fight off infections.   Your baby has a lower incidence of asthma, allergies, and sudden infant death syndrome.   The nutrients in breast milk are better for your baby than infant formulas and are designed uniquely for your baby's needs.   Breast milk improves your  baby's brain development.   Your baby is less likely to develop other conditions, such as childhood obesity, asthma, or type 2 diabetes mellitus.  For You   Breastfeeding helps to create a very special bond between you and your baby.   Breastfeeding is convenient. Breast milk is always available at the correct temperature and costs nothing.   Breastfeeding helps to burn calories and helps you lose the weight gained during pregnancy.   Breastfeeding makes your uterus contract to its prepregnancy size faster and slows bleeding (lochia) after you give birth.   Breastfeeding helps to lower your risk of developing type 2 diabetes mellitus, osteoporosis, and breast or ovarian cancer later in life. SIGNS THAT YOUR BABY IS HUNGRY Early Signs of Hunger  Increased alertness or activity.  Stretching.  Movement of the head from side to side.  Movement of the head and opening of the mouth when the corner of the mouth or cheek is stroked (rooting).  Increased sucking sounds, smacking lips, cooing, sighing, or squeaking.  Hand-to-mouth movements.  Increased sucking of  fingers or hands. Late Signs of Hunger  Fussing.  Intermittent crying. Extreme Signs of Hunger Signs of extreme hunger will require calming and consoling before your baby will be able to breastfeed successfully. Do not wait for the following signs of extreme hunger to occur before you initiate breastfeeding:   Restlessness.  A loud, strong cry.   Screaming. BREASTFEEDING BASICS Breastfeeding Initiation  Find a comfortable place to sit or lie down, with your neck and back well supported.  Place a pillow or rolled up blanket under your baby to bring him or her to the level of your breast (if you are seated). Nursing pillows are specially designed to help support your arms and your baby while you breastfeed.  Make sure that your baby's abdomen is facing your abdomen.   Gently massage your breast. With your  fingertips, massage from your chest wall toward your nipple in a circular motion. This encourages milk flow. You may need to continue this action during the feeding if your milk flows slowly.  Support your breast with 4 fingers underneath and your thumb above your nipple. Make sure your fingers are well away from your nipple and your baby's mouth.   Stroke your baby's lips gently with your finger or nipple.   When your baby's mouth is open wide enough, quickly bring your baby to your breast, placing your entire nipple and as much of the colored area around your nipple (areola) as possible into your baby's mouth.   More areola should be visible above your baby's upper lip than below the lower lip.   Your baby's tongue should be between his or her lower gum and your breast.   Ensure that your baby's mouth is correctly positioned around your nipple (latched). Your baby's lips should create a seal on your breast and be turned out (everted).  It is common for your baby to suck about 2-3 minutes in order to start the flow of breast milk. Latching Teaching your baby how to latch on to your breast properly is very important. An improper latch can cause nipple pain and decreased milk supply for you and poor weight gain in your baby. Also, if your baby is not latched onto your nipple properly, he or she may swallow some air during feeding. This can make your baby fussy. Burping your baby when you switch breasts during the feeding can help to get rid of the air. However, teaching your baby to latch on properly is still the best way to prevent fussiness from swallowing air while breastfeeding. Signs that your baby has successfully latched on to your nipple:    Silent tugging or silent sucking, without causing you pain.   Swallowing heard between every 3-4 sucks.    Muscle movement above and in front of his or her ears while sucking.  Signs that your baby has not successfully latched on to  nipple:   Sucking sounds or smacking sounds from your baby while breastfeeding.  Nipple pain. If you think your baby has not latched on correctly, slip your finger into the corner of your baby's mouth to break the suction and place it between your baby's gums. Attempt breastfeeding initiation again. Signs of Successful Breastfeeding Signs from your baby:   A gradual decrease in the number of sucks or complete cessation of sucking.   Falling asleep.   Relaxation of his or her body.   Retention of a small amount of milk in his or her mouth.   Letting go  of your breast by himself or herself. Signs from you:  Breasts that have increased in firmness, weight, and size 1-3 hours after feeding.   Breasts that are softer immediately after breastfeeding.  Increased milk volume, as well as a change in milk consistency and color by the fifth day of breastfeeding.   Nipples that are not sore, cracked, or bleeding. Signs That Your Randel Books is Getting Enough Milk  Wetting at least 3 diapers in a 24-hour period. The urine should be clear and pale yellow by age 762 days.  At least 3 stools in a 24-hour period by age 762 days. The stool should be soft and yellow.  At least 3 stools in a 24-hour period by age 76 days. The stool should be seedy and yellow.  No loss of weight greater than 10% of birth weight during the first 51 days of age.  Average weight gain of 4-7 ounces (113-198 g) per week after age 8 days.  Consistent daily weight gain by age 84 days, without weight loss after the age of 2 weeks. After a feeding, your baby may spit up a small amount. This is common. BREASTFEEDING FREQUENCY AND DURATION Frequent feeding will help you make more milk and can prevent sore nipples and breast engorgement. Breastfeed when you feel the need to reduce the fullness of your breasts or when your baby shows signs of hunger. This is called "breastfeeding on demand." Avoid introducing a pacifier to your  baby while you are working to establish breastfeeding (the first 4-6 weeks after your baby is born). After this time you may choose to use a pacifier. Research has shown that pacifier use during the first year of a baby's life decreases the risk of sudden infant death syndrome (SIDS). Allow your baby to feed on each breast as long as he or she wants. Breastfeed until your baby is finished feeding. When your baby unlatches or falls asleep while feeding from the first breast, offer the second breast. Because newborns are often sleepy in the first few weeks of life, you may need to awaken your baby to get him or her to feed. Breastfeeding times will vary from baby to baby. However, the following rules can serve as a guide to help you ensure that your baby is properly fed:  Newborns (babies 17 weeks of age or younger) may breastfeed every 1-3 hours.  Newborns should not go longer than 3 hours during the day or 5 hours during the night without breastfeeding.  You should breastfeed your baby a minimum of 8 times in a 24-hour period until you begin to introduce solid foods to your baby at around 6 months of age. BREAST MILK PUMPING Pumping and storing breast milk allows you to ensure that your baby is exclusively fed your breast milk, even at times when you are unable to breastfeed. This is especially important if you are going back to work while you are still breastfeeding or when you are not able to be present during feedings. Your lactation consultant can give you guidelines on how long it is safe to store breast milk.  A breast pump is a machine that allows you to pump milk from your breast into a sterile bottle. The pumped breast milk can then be stored in a refrigerator or freezer. Some breast pumps are operated by hand, while others use electricity. Ask your lactation consultant which type will work best for you. Breast pumps can be purchased, but some hospitals and breastfeeding support groups  lease  breast pumps on a monthly basis. A lactation consultant can teach you how to hand express breast milk, if you prefer not to use a pump.  CARING FOR YOUR BREASTS WHILE YOU BREASTFEED Nipples can become dry, cracked, and sore while breastfeeding. The following recommendations can help keep your breasts moisturized and healthy:  Avoid using soap on your nipples.   Wear a supportive bra. Although not required, special nursing bras and tank tops are designed to allow access to your breasts for breastfeeding without taking off your entire bra or top. Avoid wearing underwire-style bras or extremely tight bras.  Air dry your nipples for 3-64minutes after each feeding.   Use only cotton bra pads to absorb leaked breast milk. Leaking of breast milk between feedings is normal.   Use lanolin on your nipples after breastfeeding. Lanolin helps to maintain your skin's normal moisture barrier. If you use pure lanolin, you do not need to wash it off before feeding your baby again. Pure lanolin is not toxic to your baby. You may also hand express a few drops of breast milk and gently massage that milk into your nipples and allow the milk to air dry. In the first few weeks after giving birth, some women experience extremely full breasts (engorgement). Engorgement can make your breasts feel heavy, warm, and tender to the touch. Engorgement peaks within 3-5 days after you give birth. The following recommendations can help ease engorgement:  Completely empty your breasts while breastfeeding or pumping. You may want to start by applying warm, moist heat (in the shower or with warm water-soaked hand towels) just before feeding or pumping. This increases circulation and helps the milk flow. If your baby does not completely empty your breasts while breastfeeding, pump any extra milk after he or she is finished.  Wear a snug bra (nursing or regular) or tank top for 1-2 days to signal your body to slightly decrease milk  production.  Apply ice packs to your breasts, unless this is too uncomfortable for you.  Make sure that your baby is latched on and positioned properly while breastfeeding. If engorgement persists after 48 hours of following these recommendations, contact your health care provider or a Science writer. OVERALL HEALTH CARE RECOMMENDATIONS WHILE BREASTFEEDING  Eat healthy foods. Alternate between meals and snacks, eating 3 of each per day. Because what you eat affects your breast milk, some of the foods may make your baby more irritable than usual. Avoid eating these foods if you are sure that they are negatively affecting your baby.  Drink milk, fruit juice, and water to satisfy your thirst (about 10 glasses a day).   Rest often, relax, and continue to take your prenatal vitamins to prevent fatigue, stress, and anemia.  Continue breast self-awareness checks.  Avoid chewing and smoking tobacco.  Avoid alcohol and drug use. Some medicines that may be harmful to your baby can pass through breast milk. It is important to ask your health care provider before taking any medicine, including all over-the-counter and prescription medicine as well as vitamin and herbal supplements. It is possible to become pregnant while breastfeeding. If birth control is desired, ask your health care provider about options that will be safe for your baby. SEEK MEDICAL CARE IF:   You feel like you want to stop breastfeeding or have become frustrated with breastfeeding.  You have painful breasts or nipples.  Your nipples are cracked or bleeding.  Your breasts are red, tender, or warm.  You have  a swollen area on either breast.  You have a fever or chills.  You have nausea or vomiting.  You have drainage other than breast milk from your nipples.  Your breasts do not become full before feedings by the fifth day after you give birth.  You feel sad and depressed.  Your baby is too sleepy to eat  well.  Your baby is having trouble sleeping.   Your baby is wetting less than 3 diapers in a 24-hour period.  Your baby has less than 3 stools in a 24-hour period.  Your baby's skin or the white part of his or her eyes becomes yellow.   Your baby is not gaining weight by 93 days of age. SEEK IMMEDIATE MEDICAL CARE IF:   Your baby is overly tired (lethargic) and does not want to wake up and feed.  Your baby develops an unexplained fever. Document Released: 11/14/2005 Document Revised: 11/19/2013 Document Reviewed: 05/08/2013 [facility] Patient Information 2015 [place], [place]. This information is not intended to replace advice given to you by your health care provider. Make sure you discuss any questions you have with your health care provider.

## 2015-04-15 NOTE — Progress Notes (Signed)
PPD #2 - SVD  S:  Reports feeling well             Tolerating po/ No nausea or vomiting             Bleeding is light             Pain controlled with motrin             Up ad lib / ambulatory / voiding QS  Newborn bottle-feeding / planning to pump breastmilk  / female - on double bili lights not being d/c'd today; maybe tomorrow  O:               VS: BP 90/54 mmHg  Pulse 91  Temp(Src) 98.2 F (36.8 C) (Oral)  Resp 16  Ht 5\' 3"  (1.6 m)  Wt 59.421 kg (131 lb)  BMI 23.21 kg/m2  SpO2 99%  Breast/bottle feeding   LABS:               Recent Labs  04/14/15 0550 04/15/15 0620  WBC 19.1* 12.4*  HGB 12.3 11.0*  PLT 216 177               Blood type: O POS (05/16 1855)  Rubella: Immune (10/28 0000)                   TdaP current                            Physical Exam:             Alert and oriented x 3  Abdomen: soft, non-tender, non-distended              Fundus: firm, non-tender, U-1  Perineum: mild edema / ice pack in place  Lochia: moderate  Extremities: no edema, no calf pain or tenderness   A: PPD # 2 SVD with 2nd degree repair  Doing well - stable status  P: Routine post partum orders  D/C today / room-in with infant  Graceann Congress MSN, CNM  04/15/2015, 12:34 PM

## 2015-04-16 ENCOUNTER — Ambulatory Visit: Payer: Self-pay

## 2015-04-16 NOTE — Lactation Note (Addendum)
This note was copied from the chart of Robin Patrcia Schnepp. Lactation Consultation Note  Patient Name: Robin Griffin [id number] Date: 04/16/2015 Reason for consult: Follow-up assessment;Difficult latch;Hyperbilirubinemia FOB called for [facility] assist with latching baby to right breast. Reviewed with Mom how to apply nipple shield to right breast. Baby latched well, lips still needs to be un-tucked off/on but baby demonstrates a much better suckling pattern using the nipple shield with audible swallows. Mom denied discomfort on the breast with this feeding. Lots of breast milk in the nipple shield at the end of the feeding. Advised parents baby should be at the breast 8-12 times or more in 24 hours nursing for 15-20 minutes both breasts each feeding. Mom reports she may want to pump and supplement some feedings, LC stressed importance of breasts being stimulated either with BF or pumping or both every 3 hours. Parents concerned about how to assess if baby getting enough at the breast and feeding plan. LC advised parents of what to look for with BF. Advised Baby should be at the breast 8-12 times or more in 24 hours for 15-20 minutes both breasts each feeding when possible. Use the #20 nipple shield to latch and be sure to see breast milk in the nipple shield. If baby satiated with the feeding, adequate voids/stools, baby waking to BF then may not have to supplement. If not then continue supplements, or if baby only BF on 1 breast each feeding.  Advised if they do supplement to use EBM so Mom does not get engorged. Advised if supplementing some feedings instead of BF then Mom to pump for 15 minutes while FOB gives supplement - continue with at least 28 ml today increasing per guidelines. New 5 fr feeding tube/syringe given to parents to use for now.  F/u at [facility] tomorrow for weight check.  The phone number for lactation at [facility] given to FOB for f/u lactation appointment. Encouraged to call and  schedule for next week. Family lives in [place] and wanted a closer lactation appointment.  Advised to refer to page 24 Baby N Me booklet for chart with I/O and engorgement care. Advised to refer to page 25 for Milk storage guidelines.   Maternal Data    Feeding Feeding Type: Breast Fed Length of feed: 15 min  LATCH Score/Interventions Latch: Grasps breast easily, tongue down, lips flanged, rhythmical sucking. (Using #20 nipple shield to latch) Intervention(s): Adjust position;Assist with latch;Breast massage;Breast compression  Audible Swallowing: Spontaneous and intermittent  Type of Nipple: Everted at rest and after stimulation (short nipple shaft bilateral)  Comfort (Breast/Nipple): Filling, red/small blisters or bruises, mild/mod discomfort  Problem noted: Filling;Mild/Moderate discomfort (left breast mild nipple tenderness) Interventions (Mild/moderate discomfort): Comfort gels;Hand massage;Hand expression  Hold (Positioning): Assistance needed to correctly position infant at breast and maintain latch. Intervention(s): Breastfeeding basics reviewed;Support Pillows;Position options;Skin to skin  LATCH Score: 8  Lactation Tools Discussed/Used Tools: Nipple Shields;Pump;Comfort gels;16F feeding tube / Syringe Nipple shield size: 20 Breast pump type: Double-Electric Breast Pump   Consult Status Consult Status: Complete Date: 04/16/15 Follow-up type: In-patient    Katrine Coho 04/16/2015, 4:41 PM

## 2015-04-16 NOTE — Lactation Note (Signed)
This note was copied from the chart of [name]. Lactation Consultation Note  Patient Name: [name] [id number] Date: 04/16/2015 Reason for consult: Follow-up assessment;Difficult latch;Hyperbilirubinemia Mom had baby at the breast when Emmett arrived. Baby very shallow latch and sleepy. With stimulation, baby would take a few suckles but latch was very shallow and baby's lips were tucked. Demonstrated to Mom how to un-tuck baby's lips for wider gape and deeper latch. Baby was not able to keeps lips flanged. Mom reports pain with nursing on the left breast. Applied #20 nipple shield to obtain more depth. Baby's lips needed to be adjusted off/on to keep un-tucked. Baby also has recessed chin and short posterior lingual frenulum along with upper lip labial frenulum. With nursing with the nipple shield Mom pain improved and over time baby was able to obtain more depth. Lots of breast milk in the nipple shield after nursing for 5 minutes on the left breast. Baby asleep. Mom's breasts are filling, not engorged. Advised parents would like to see another feeding on the right breast next feeding. Parents to call.  FOB reports he will get DEBP from [employer] today per his insurance. Parents a little overwhelmed with breastfeeding and Mom wants to BF along with pump/bottle. LC stressed importance of breast being stimulated at least every 3 hours to encourage milk production, prevent engorgement and protect milk supply. Parents to call with next feeding.   Maternal Data    Feeding Feeding Type: Breast Fed Length of feed: 5 min  LATCH Score/Interventions Latch: Grasps breast easily, tongue down, lips flanged, rhythmical sucking. (using #20 nipple shield) Intervention(s): Adjust position;Assist with latch;Breast massage;Breast compression  Audible Swallowing: Spontaneous and intermittent  Type of Nipple: Everted at rest and after stimulation (short nipple shafts bilateral)  Comfort  (Breast/Nipple): Filling, red/small blisters or bruises, mild/mod discomfort  Problem noted: Filling;Mild/Moderate discomfort (left nipple, no breakdown noted) Interventions (Mild/moderate discomfort): Hand expression;Hand massage  Hold (Positioning): Assistance needed to correctly position infant at breast and maintain latch. Intervention(s): Breastfeeding basics reviewed;Support Pillows;Position options;Skin to skin  LATCH Score: 8  Lactation Tools Discussed/Used Tools: Nipple [employer];Pump Nipple shield size: 20 Breast pump type: Double-Electric Breast Pump   Consult Status Consult Status: Follow-up Date: 04/16/15 Follow-up type: In-patient    Katrine Coho 04/16/2015, 4:29 PM

## 2015-04-16 NOTE — Lactation Note (Signed)
This note was copied from the chart of Robin Sharlena Kristensen. Lactation Consultation Note  Patient Name: Robin Griffin [id number] Date: 04/16/2015 Reason for consult: Follow-up assessment;Hyperbilirubinemia Baby will be d/c today, off photo therapy. Baby asleep at this visit, recently had supplement. Baby is having a lot of supplements.  Discussed with parents feeding options. Mom does want baby to be at the breast. FOB to call insurance about DEBP for home use and Jefferson left phone number for Mom to call with next feeding to observe latch and help parents define feeding plan for home.   Maternal Data    Feeding Feeding Type: Formula Length of feed: 15 min  LATCH Score/Interventions                      Lactation Tools Discussed/Used     Consult Status Consult Status: Follow-up Date: 04/16/15 Follow-up type: In-patient    Katrine Coho 04/16/2015, 11:57 AM

## 2015-11-03 ENCOUNTER — Encounter: Payer: Self-pay | Admitting: Physician Assistant

## 2015-11-03 ENCOUNTER — Ambulatory Visit (INDEPENDENT_AMBULATORY_CARE_PROVIDER_SITE_OTHER): Payer: 59 | Admitting: Physician Assistant

## 2015-11-03 VITALS — BP 120/80 | HR 78 | Temp 98.3°F | Resp 16 | Ht 61.05 in | Wt 135.0 lb

## 2015-11-03 DIAGNOSIS — Z136 Encounter for screening for cardiovascular disorders: Secondary | ICD-10-CM

## 2015-11-03 DIAGNOSIS — IMO0001 Reserved for inherently not codable concepts without codable children: Secondary | ICD-10-CM

## 2015-11-03 DIAGNOSIS — Z124 Encounter for screening for malignant neoplasm of cervix: Secondary | ICD-10-CM | POA: Diagnosis not present

## 2015-11-03 DIAGNOSIS — Z0000 Encounter for general adult medical examination without abnormal findings: Secondary | ICD-10-CM

## 2015-11-03 DIAGNOSIS — Z7189 Other specified counseling: Secondary | ICD-10-CM | POA: Diagnosis not present

## 2015-11-03 DIAGNOSIS — Z13220 Encounter for screening for lipoid disorders: Secondary | ICD-10-CM | POA: Diagnosis not present

## 2015-11-03 DIAGNOSIS — Z7689 Persons encountering health services in other specified circumstances: Secondary | ICD-10-CM

## 2015-11-03 NOTE — Patient Instructions (Signed)
Health Maintenance, Female Adopting a healthy lifestyle and getting preventive care can go a long way to promote health and wellness. Talk with your health care provider about what schedule of regular examinations is right for you. This is a good chance for you to check in with your provider about disease prevention and staying healthy. In between checkups, there are plenty of things you can do on your own. Experts have done a lot of research about which lifestyle changes and preventive measures are most likely to keep you healthy. Ask your health care provider for more information. WEIGHT AND DIET  Eat a healthy diet  Be sure to include plenty of vegetables, fruits, low-fat dairy products, and lean protein.  Do not eat a lot of foods high in solid fats, added sugars, or salt.  Get regular exercise. This is one of the most important things you can do for your health.  Most adults should exercise for at least 150 minutes each week. The exercise should increase your heart rate and make you sweat (moderate-intensity exercise).  Most adults should also do strengthening exercises at least twice a week. This is in addition to the moderate-intensity exercise.  Maintain a healthy weight  Body mass index (BMI) is a measurement that can be used to identify possible weight problems. It estimates body fat based on height and weight. Your health care provider can help determine your BMI and help you achieve or maintain a healthy weight.  For females 20 years of age and older:   A BMI below 18.5 is considered underweight.  A BMI of 18.5 to 24.9 is normal.  A BMI of 25 to 29.9 is considered overweight.  A BMI of 30 and above is considered obese.  Watch levels of cholesterol and blood lipids  You should start having your blood tested for lipids and cholesterol at 26 years of age, then have this test every 5 years.  You may need to have your cholesterol levels checked more often if:  Your lipid  or cholesterol levels are high.  You are older than 26 years of age.  You are at high risk for heart disease.  CANCER SCREENING   Lung Cancer  Lung cancer screening is recommended for adults 55-80 years old who are at high risk for lung cancer because of a history of smoking.  A yearly low-dose CT scan of the lungs is recommended for people who:  Currently smoke.  Have quit within the past 15 years.  Have at least a 30-pack-year history of smoking. A pack year is smoking an average of one pack of cigarettes a day for 1 year.  Yearly screening should continue until it has been 15 years since you quit.  Yearly screening should stop if you develop a health problem that would prevent you from having lung cancer treatment.  Breast Cancer  Practice breast self-awareness. This means understanding how your breasts normally appear and feel.  It also means doing regular breast self-exams. Let your health care provider know about any changes, no matter how small.  If you are in your 20s or 30s, you should have a clinical breast exam (CBE) by a health care provider every 1-3 years as part of a regular health exam.  If you are 40 or older, have a CBE every year. Also consider having a breast X-ray (mammogram) every year.  If you have a family history of breast cancer, talk to your health care provider about genetic screening.  If you   are at high risk for breast cancer, talk to your health care provider about having an MRI and a mammogram every year.  Breast cancer gene (BRCA) assessment is recommended for women who have family members with BRCA-related cancers. BRCA-related cancers include:  Breast.  Ovarian.  Tubal.  Peritoneal cancers.  Results of the assessment will determine the need for genetic counseling and BRCA1 and BRCA2 testing. Cervical Cancer Your health care provider may recommend that you be screened regularly for cancer of the pelvic organs (ovaries, uterus, and  vagina). This screening involves a pelvic examination, including checking for microscopic changes to the surface of your cervix (Pap test). You may be encouraged to have this screening done every 3 years, beginning at age 21.  For women ages 30-65, health care providers may recommend pelvic exams and Pap testing every 3 years, or they may recommend the Pap and pelvic exam, combined with testing for human papilloma virus (HPV), every 5 years. Some types of HPV increase your risk of cervical cancer. Testing for HPV may also be done on women of any age with unclear Pap test results.  Other health care providers may not recommend any screening for nonpregnant women who are considered low risk for pelvic cancer and who do not have symptoms. Ask your health care provider if a screening pelvic exam is right for you.  If you have had past treatment for cervical cancer or a condition that could lead to cancer, you need Pap tests and screening for cancer for at least 20 years after your treatment. If Pap tests have been discontinued, your risk factors (such as having a new sexual partner) need to be reassessed to determine if screening should resume. Some women have medical problems that increase the chance of getting cervical cancer. In these cases, your health care provider may recommend more frequent screening and Pap tests. Colorectal Cancer  This type of cancer can be detected and often prevented.  Routine colorectal cancer screening usually begins at 26 years of age and continues through 26 years of age.  Your health care provider may recommend screening at an earlier age if you have risk factors for colon cancer.  Your health care provider may also recommend using home test kits to check for hidden blood in the stool.  A small camera at the end of a tube can be used to examine your colon directly (sigmoidoscopy or colonoscopy). This is done to check for the earliest forms of colorectal  cancer.  Routine screening usually begins at age 50.  Direct examination of the colon should be repeated every 5-10 years through 26 years of age. However, you may need to be screened more often if early forms of precancerous polyps or small growths are found. Skin Cancer  Check your skin from head to toe regularly.  Tell your health care provider about any new moles or changes in moles, especially if there is a change in a mole's shape or color.  Also tell your health care provider if you have a mole that is larger than the size of a pencil eraser.  Always use sunscreen. Apply sunscreen liberally and repeatedly throughout the day.  Protect yourself by wearing long sleeves, pants, a wide-brimmed hat, and sunglasses whenever you are outside. HEART DISEASE, DIABETES, AND HIGH BLOOD PRESSURE   High blood pressure causes heart disease and increases the risk of stroke. High blood pressure is more likely to develop in:  People who have blood pressure in the high end   of the normal range (130-139/85-89 mm Hg).  People who are overweight or obese.  People who are African American.  If you are 38-23 years of age, have your blood pressure checked every 3-5 years. If you are 61 years of age or older, have your blood pressure checked every year. You should have your blood pressure measured twice--once when you are at a hospital or clinic, and once when you are not at a hospital or clinic. Record the average of the two measurements. To check your blood pressure when you are not at a hospital or clinic, you can use:  An automated blood pressure machine at a pharmacy.  A home blood pressure monitor.  If you are between 45 years and 39 years old, ask your health care provider if you should take aspirin to prevent strokes.  Have regular diabetes screenings. This involves taking a blood sample to check your fasting blood sugar level.  If you are at a normal weight and have a low risk for diabetes,  have this test once every three years after 26 years of age.  If you are overweight and have a high risk for diabetes, consider being tested at a younger age or more often. PREVENTING INFECTION  Hepatitis B  If you have a higher risk for hepatitis B, you should be screened for this virus. You are considered at high risk for hepatitis B if:  You were born in a country where hepatitis B is common. Ask your health care provider which countries are considered high risk.  Your parents were born in a high-risk country, and you have not been immunized against hepatitis B (hepatitis B vaccine).  You have HIV or AIDS.  You use needles to inject street drugs.  You live with someone who has hepatitis B.  You have had sex with someone who has hepatitis B.  You get hemodialysis treatment.  You take certain medicines for conditions, including cancer, organ transplantation, and autoimmune conditions. Hepatitis C  Blood testing is recommended for:  Everyone born from [birth year] through 1967.  Anyone with known risk factors for hepatitis C. Sexually transmitted infections (STIs)  You should be screened for sexually transmitted infections (STIs) including gonorrhea and chlamydia if:  You are sexually active and are younger than 26 years of age.  You are older than 26 years of age and your health care provider tells you that you are at risk for this type of infection.  Your sexual activity has changed since you were last screened and you are at an increased risk for chlamydia or gonorrhea. Ask your health care provider if you are at risk.  If you do not have HIV, but are at risk, it may be recommended that you take a prescription medicine daily to prevent HIV infection. This is called pre-exposure prophylaxis (PrEP). You are considered at risk if:  You are sexually active and do not regularly use condoms or know the HIV status of your partner(s).  You take drugs by injection.  You are sexually  active with a partner who has HIV. Talk with your health care provider about whether you are at high risk of being infected with HIV. If you choose to begin PrEP, you should first be tested for HIV. You should then be tested every 3 months for as long as you are taking PrEP.  PREGNANCY   If you are premenopausal and you may become pregnant, ask your health care provider about preconception counseling.  If you may  become pregnant, take 400 to 800 micrograms (mcg) of folic acid every day.  If you want to prevent pregnancy, talk to your health care provider about birth control (contraception). OSTEOPOROSIS AND MENOPAUSE   Osteoporosis is a disease in which the bones lose minerals and strength with aging. This can result in serious bone fractures. Your risk for osteoporosis can be identified using a bone density scan.  If you are 61 years of age or older, or if you are at risk for osteoporosis and fractures, ask your health care provider if you should be screened.  Ask your health care provider whether you should take a calcium or vitamin D supplement to lower your risk for osteoporosis.  Menopause may have certain physical symptoms and risks.  Hormone replacement therapy may reduce some of these symptoms and risks. Talk to your health care provider about whether hormone replacement therapy is right for you.  HOME CARE INSTRUCTIONS   Schedule regular health, dental, and eye exams.  Stay current with your immunizations.   Do not use any tobacco products including cigarettes, chewing tobacco, or electronic cigarettes.  If you are pregnant, do not drink alcohol.  If you are breastfeeding, limit how much and how often you drink alcohol.  Limit alcohol intake to no more than 1 drink per day for nonpregnant women. One drink equals 12 ounces of beer, 5 ounces of wine, or 1 ounces of hard liquor.  Do not use street drugs.  Do not share needles.  Ask your health care provider for help if  you need support or information about quitting drugs.  Tell your health care provider if you often feel depressed.  Tell your health care provider if you have ever been abused or do not feel safe at home.   This information is not intended to replace advice given to you by your health care provider. Make sure you discuss any questions you have with your health care provider.   Document Released: 05/30/2011 Document Revised: 12/05/2014 Document Reviewed: 10/16/2013 Elsevier Interactive Patient Education [employer].

## 2015-11-03 NOTE — Progress Notes (Signed)
Patient: Robin Griffin, Female    DOB: 1991/01/14, 24 y.o.   MRN: [id number] Visit Date: 11/03/2015  Today's Provider: Mar Daring, PA-C   Chief Complaint  Patient presents with  . Establish Care   Subjective:    Annual physical exam Robin Griffin is a 24 y.o. female who presents today for health maintenance and complete physical. She feels well. She reports exercising, walks daily . She reports she is sleeping well. She did recently give birth in May 2016 vaginally without complication. She has not been seen by her gynecologist since her postpartum evaluation. She's had no complications since. She does not think she has had a Pap smear. There is no family or personal history of cervical, ovarian or uterine cancer. She does not perform self breast exams. There is no family history of breast cancer. She has never had a colonoscopy. There is no family history of colon cancer. Patient got her Influenza vaccine 10/06/15 at [facility]. -----------------------------------------------------------------  Review of Systems  Constitutional: Negative.   HENT: Negative.   Eyes: Negative.   Respiratory: Negative.   Cardiovascular: Negative.   Gastrointestinal: Negative.   Endocrine: Negative.   Genitourinary: Negative.   Musculoskeletal: Negative.   Skin: Negative.   Allergic/Immunologic: Negative.   Neurological: Negative.   Hematological: Negative.   Psychiatric/Behavioral: Negative.     Social History      She  reports that she has never smoked. She has never used smokeless tobacco. She reports that she does not drink alcohol or use illicit drugs.       Social History   Social History  . Marital Status: Married    Spouse Name: N/A  . Number of Children: N/A  . Years of Education: N/A   Social History Main Topics  . Smoking status: Never Smoker   . Smokeless tobacco: Never Used  . Alcohol Use: No  . Drug Use: No  . Sexual Activity: Yes   Other  Topics Concern  . None   Social History Narrative    Patient Active Problem List   Diagnosis Date Noted  . SVD (spontaneous vaginal delivery) 04/14/2015  . Postpartum care following vaginal delivery (5/16) 04/14/2015  . Active labor at term 04/13/2015    History reviewed. No pertinent past surgical history.  Family History        Family Status  Relation Status Death Age  . Mother Alive   . Father Alive   . Sister Alive   . Brother Alive         Her family history is not on file.    No Known Allergies  Previous Medications   No medications on file    Patient Care Team: Mar Daring, PA-C as PCP - General (Family Medicine)     Objective:   Vitals: BP 120/80 mmHg  Pulse 78  Temp(Src) 98.3 F (36.8 C) (Oral)  Resp 16  Ht 5' 1.05" (1.551 m)  Wt 135 lb (61.236 kg)  BMI 25.46 kg/m2  SpO2 97%  LMP 10/12/2015   Physical Exam  Constitutional: She is oriented to person, place, and time. She appears well-developed and well-nourished. No distress.  HENT:  Head: Normocephalic and atraumatic.  Right Ear: Hearing, tympanic membrane, external ear and ear canal normal.  Left Ear: Hearing, tympanic membrane, external ear and ear canal normal.  Nose: Nose normal.  Mouth/Throat: Uvula is midline, oropharynx is clear and moist and mucous membranes are normal. No oropharyngeal exudate.  Eyes:  Conjunctivae and EOM are normal. Pupils are equal, round, and reactive to light. Right eye exhibits no discharge. Left eye exhibits no discharge. No scleral icterus.  Neck: Normal range of motion. Neck supple. No JVD present. Carotid bruit is not present. No tracheal deviation present. No thyromegaly present.  Cardiovascular: Normal rate, regular rhythm, normal heart sounds and intact distal pulses.  Exam reveals no gallop and no friction rub.   No murmur heard. Pulmonary/Chest: Effort normal and breath sounds normal. No respiratory distress. She has no wheezes. She has no rales. She  exhibits no tenderness. Right breast exhibits no inverted nipple, no mass, no nipple discharge, no skin change and no tenderness. Left breast exhibits no inverted nipple, no mass, no nipple discharge, no skin change and no tenderness. Breasts are symmetrical.  Abdominal: Soft. Bowel sounds are normal. She exhibits no distension and no mass. There is no tenderness. There is no rebound and no guarding. Hernia confirmed negative in the right inguinal area and confirmed negative in the left inguinal area.  Genitourinary: Rectum normal, vagina normal and uterus normal. No breast swelling, tenderness, discharge or bleeding. Pelvic exam was performed with patient supine. There is no rash, tenderness, lesion or injury on the right labia. There is no rash, tenderness, lesion or injury on the left labia. Cervix exhibits discharge (yellowish-green discharge noted at cervical os). Cervix exhibits no motion tenderness and no friability. Right adnexum displays no mass, no tenderness and no fullness. Left adnexum displays no mass, no tenderness and no fullness. No erythema, tenderness or bleeding in the vagina. No signs of injury around the vagina. No vaginal discharge found.  Musculoskeletal: Normal range of motion. She exhibits no edema or tenderness.  Lymphadenopathy:    She has no cervical adenopathy.       Right: No inguinal adenopathy present.       Left: No inguinal adenopathy present.  Neurological: She is alert and oriented to person, place, and time. She has normal reflexes. No cranial nerve deficit. Coordination normal.  Skin: Skin is warm and dry. No rash noted. She is not diaphoretic.  Psychiatric: She has a normal mood and affect. Her behavior is normal. Judgment and thought content normal.  Vitals reviewed.    Depression Screen No flowsheet data found.    Assessment & Plan:     Routine Health Maintenance and Physical Exam  1. Cervical cancer screening Pap was collected today. I will  follow-up with her pending Pap results. If Pap is normal I will see her back next year for her repeat annual physical exam. If Pap is normal she will not need another Pap smear for 3 years. - Pap IG w/ reflex to HPV when ASC-U  2. Annual physical exam Exam today was normal. We will check labs as below. Follow-up pending lab results. If labs are within normal limits and stable I will follow-up with her in one year for her repeat physical exam. - CBC With Differential - TSH - Comprehensive Metabolic Panel (CMET)  3. Encounter for cholesteral screening for cardiovascular disease She has never had her cholesterol checked. I will check her lipid panel as below. I will follow-up with her pending lab results. If labs are stable and within normal limits cholesterol will not need to be checked for another 5 years. - Lipid panel  4. Establishing care with new doctor, encounter for No previous primary care physician. Was seen by gynecology at the hospital when she gave birth to her daughter in May 2016.  Exercise Activities and Dietary recommendations Goals    None      There is no immunization history for the selected administration types on file for this patient.  Health Maintenance  Topic Date Due  . TETANUS/TDAP  05/07/2008  . PAP SMEAR  05/07/2010  . INFLUENZA VACCINE  06/29/2015  . HIV Screening  Completed      Discussed health benefits of physical activity, and encouraged her to engage in regular exercise appropriate for her age and condition.    --------------------------------------------------------------------

## 2015-11-04 ENCOUNTER — Telehealth: Payer: Self-pay

## 2015-11-04 LAB — LIPID PANEL
CHOL/HDL RATIO: 3.5 ratio (ref 0.0–4.4)
CHOLESTEROL TOTAL: 190 mg/dL (ref 100–199)
HDL: 55 mg/dL (ref >39)
LDL CALC: 123 mg/dL — AB (ref 0–99)
TRIGLYCERIDES: 62 mg/dL (ref 0–149)
VLDL Cholesterol Cal: 12 mg/dL (ref 5–40)

## 2015-11-04 LAB — COMPREHENSIVE METABOLIC PANEL
ALK PHOS: 81 IU/L (ref 39–117)
ALT: 15 IU/L (ref 0–32)
AST: 18 IU/L (ref 0–40)
Albumin/Globulin Ratio: 1.7 (ref 1.1–2.5)
Albumin: 4.5 g/dL (ref 3.5–5.5)
BUN/Creatinine Ratio: 11 (ref 8–20)
BUN: 8 mg/dL (ref 6–20)
Bilirubin Total: 0.4 mg/dL (ref 0.0–1.2)
CALCIUM: 9.2 mg/dL (ref 8.7–10.2)
CO2: 24 mmol/L (ref 18–29)
CREATININE: 0.74 mg/dL (ref 0.57–1.00)
Chloride: 103 mmol/L (ref 97–106)
GFR calc Af Amer: 129 mL/min/{1.73_m2} (ref >59)
GFR, EST NON AFRICAN AMERICAN: 112 mL/min/{1.73_m2} (ref >59)
Globulin, Total: 2.7 g/dL (ref 1.5–4.5)
Glucose: 82 mg/dL (ref 65–99)
POTASSIUM: 4.5 mmol/L (ref 3.5–5.2)
SODIUM: 139 mmol/L (ref 136–144)
Total Protein: 7.2 g/dL (ref 6.0–8.5)

## 2015-11-04 LAB — CBC WITH DIFFERENTIAL
Basophils Absolute: 0 10*3/uL (ref 0.0–0.2)
Basos: 0 %
EOS (ABSOLUTE): 0.1 10*3/uL (ref 0.0–0.4)
EOS: 2 %
HEMATOCRIT: 39.4 % (ref 34.0–46.6)
HEMOGLOBIN: 13.5 g/dL (ref 11.1–15.9)
IMMATURE GRANS (ABS): 0 10*3/uL (ref 0.0–0.1)
Immature Granulocytes: 0 %
LYMPHS: 40 %
Lymphocytes Absolute: 2.8 10*3/uL (ref 0.7–3.1)
MCH: 28.5 pg (ref 26.6–33.0)
MCHC: 34.3 g/dL (ref 31.5–35.7)
MCV: 83 fL (ref 79–97)
MONOCYTES: 8 %
Monocytes Absolute: 0.6 10*3/uL (ref 0.1–0.9)
Neutrophils Absolute: 3.4 10*3/uL (ref 1.4–7.0)
Neutrophils: 50 %
RBC: 4.74 x10E6/uL (ref 3.77–5.28)
RDW: 12.7 % (ref 12.3–15.4)
WBC: 6.9 10*3/uL (ref 3.4–10.8)

## 2015-11-04 LAB — TSH: TSH: 1.43 u[IU]/mL (ref 0.450–4.500)

## 2015-11-04 NOTE — Telephone Encounter (Signed)
-----   Message from Mar Daring, [place] sent at 11/04/2015  9:26 AM EST ----- All labs are within normal limits and stable.  Still awaiting pap smear results.  Thanks! -JB

## 2015-11-04 NOTE — Telephone Encounter (Signed)
Patient advised as directed below.  Thanks,  -Robin Griffin 

## 2015-11-06 ENCOUNTER — Telehealth: Payer: Self-pay

## 2015-11-06 LAB — PAP IG W/ RFLX HPV ASCU: PAP SMEAR COMMENT: 0

## 2015-11-06 NOTE — Telephone Encounter (Signed)
-----   Message from Mar Daring, [place] sent at 11/06/2015 11:51 AM EST ----- Pap is normal.  Will repeat in 3 years.

## 2015-11-06 NOTE — Telephone Encounter (Signed)
Patient advised as directed below,  Thanks,  -Fines Kimberlin

## 2016-07-27 ENCOUNTER — Other Ambulatory Visit: Payer: Self-pay | Admitting: Obstetrics & Gynecology

## 2016-08-11 NOTE — Patient Instructions (Signed)
Your procedure is scheduled on:  Tuesday, Sept. 19, 2017  Enter through the [employer] [facility] at:  11:30 AM  Pick up the phone at the desk and dial [phone].  Call this number if you have problems the morning of surgery: [phone].  Remember: Do NOT eat food:  After Midnight Monday  Do NOT drink clear liquids after:  9:00 AM day of surgery  Take these medicines the morning of surgery with a SIP OF WATER:    Do NOT wear jewelry (body piercing), metal hair clips/bobby pins, make-up, or nail polish. Do NOT wear lotions, powders, or perfumes.  You may wear deodorant. Do NOT shave for 48 hours prior to surgery. Do NOT bring valuables to the hospital. Contacts, dentures, or bridgework may not be worn into surgery.  Have a responsible adult drive you home and stay with you for 24 hours after your procedure

## 2016-08-12 ENCOUNTER — Encounter (HOSPITAL_COMMUNITY)
Admission: RE | Admit: 2016-08-12 | Discharge: 2016-08-12 | Disposition: A | Discharge status: Home / Self Care | Payer: 59 | Source: Ambulatory Visit | Attending: Obstetrics & Gynecology | Admitting: Obstetrics & Gynecology

## 2016-08-12 ENCOUNTER — Encounter (HOSPITAL_COMMUNITY): Payer: Self-pay

## 2016-08-12 DIAGNOSIS — Z01812 Encounter for preprocedural laboratory examination: Secondary | ICD-10-CM | POA: Insufficient documentation

## 2016-08-12 HISTORY — DX: Headache: R51

## 2016-08-16 ENCOUNTER — Ambulatory Visit (HOSPITAL_COMMUNITY): Payer: 59 | Admitting: Anesthesiology

## 2016-08-16 ENCOUNTER — Encounter (HOSPITAL_COMMUNITY)
Admission: RE | Disposition: A | Discharge status: Home / Self Care | Payer: Self-pay | Source: Ambulatory Visit | Attending: Obstetrics & Gynecology

## 2016-08-16 ENCOUNTER — Encounter (HOSPITAL_COMMUNITY): Payer: Self-pay | Admitting: *Deleted

## 2016-08-16 ENCOUNTER — Ambulatory Visit (HOSPITAL_COMMUNITY)
Admission: RE | Admit: 2016-08-16 | Discharge: 2016-08-16 | Disposition: A | Discharge status: Home / Self Care | Payer: 59 | Source: Ambulatory Visit | Attending: Obstetrics & Gynecology | Admitting: Obstetrics & Gynecology

## 2016-08-16 DIAGNOSIS — D271 Benign neoplasm of left ovary: Secondary | ICD-10-CM | POA: Diagnosis present

## 2016-08-16 HISTORY — DX: Benign neoplasm of left ovary: D27.1

## 2016-08-16 HISTORY — PX: LAPAROSCOPIC OVARIAN CYSTECTOMY: SHX6248

## 2016-08-16 LAB — PREGNANCY, URINE: PREG TEST UR: NEGATIVE

## 2016-08-16 SURGERY — EXCISION, CYST, OVARY, LAPAROSCOPIC
Anesthesia: General | Site: Abdomen | Laterality: Left

## 2016-08-16 MED ORDER — IBUPROFEN 200 MG PO TABS: 600.0000 mg | ORAL_TABLET | Freq: Four times a day (QID) | ORAL | 0 refills | Status: DC | PRN

## 2016-08-16 MED ORDER — FENTANYL CITRATE (PF) 100 MCG/2ML IJ SOLN
INTRAMUSCULAR | Status: DC | PRN
  Administered 2016-08-16: 50 ug via INTRAVENOUS
  Administered 2016-08-16: 100 ug via INTRAVENOUS
  Administered 2016-08-16 (×3): 50 ug via INTRAVENOUS

## 2016-08-16 MED ORDER — FENTANYL CITRATE (PF) 100 MCG/2ML IJ SOLN
INTRAMUSCULAR | Status: AC
  Filled 2016-08-16: qty 2

## 2016-08-16 MED ORDER — OXYCODONE-ACETAMINOPHEN 5-325 MG PO TABS: 1.0000 | ORAL_TABLET | Freq: Four times a day (QID) | ORAL | 0 refills | Status: DC | PRN

## 2016-08-16 MED ORDER — FENTANYL CITRATE (PF) 100 MCG/2ML IJ SOLN
25.0000 ug | INTRAMUSCULAR | Status: DC | PRN
  Administered 2016-08-16: 50 ug via INTRAVENOUS

## 2016-08-16 MED ORDER — KETOROLAC TROMETHAMINE 30 MG/ML IJ SOLN
INTRAMUSCULAR | Status: AC
  Filled 2016-08-16: qty 1

## 2016-08-16 MED ORDER — LACTATED RINGERS IR SOLN
Status: DC | PRN
  Administered 2016-08-16 (×2): 3000 mL

## 2016-08-16 MED ORDER — ROCURONIUM BROMIDE 100 MG/10ML IV SOLN
INTRAVENOUS | Status: AC
  Filled 2016-08-16: qty 1

## 2016-08-16 MED ORDER — LACTATED RINGERS IV SOLN
INTRAVENOUS | Status: DC
  Administered 2016-08-16 (×2): via INTRAVENOUS

## 2016-08-16 MED ORDER — LIDOCAINE HCL (CARDIAC) 20 MG/ML IV SOLN
INTRAVENOUS | Status: DC | PRN
  Administered 2016-08-16: 60 mg via INTRAVENOUS

## 2016-08-16 MED ORDER — OXYCODONE HCL 5 MG/5ML PO SOLN: 5.0000 mg | Freq: Once | ORAL | Status: DC | PRN

## 2016-08-16 MED ORDER — OXYCODONE HCL 5 MG PO TABS: 5.0000 mg | ORAL_TABLET | Freq: Once | ORAL | Status: DC | PRN

## 2016-08-16 MED ORDER — MIDAZOLAM HCL 2 MG/2ML IJ SOLN
INTRAMUSCULAR | Status: AC
  Filled 2016-08-16: qty 2

## 2016-08-16 MED ORDER — ONDANSETRON HCL 4 MG/2ML IJ SOLN: 4.0000 mg | Freq: Once | INTRAMUSCULAR | Status: DC | PRN

## 2016-08-16 MED ORDER — PROPOFOL 10 MG/ML IV BOLUS
INTRAVENOUS | Status: DC | PRN
Start: 2016-08-16 — End: 2016-08-16
  Administered 2016-08-16: 150 mg via INTRAVENOUS

## 2016-08-16 MED ORDER — SUGAMMADEX SODIUM 200 MG/2ML IV SOLN
INTRAVENOUS | Status: DC | PRN
  Administered 2016-08-16: 116.2 mg via INTRAVENOUS

## 2016-08-16 MED ORDER — MEPERIDINE HCL 25 MG/ML IJ SOLN: 6.2500 mg | INTRAMUSCULAR | Status: DC | PRN

## 2016-08-16 MED ORDER — ONDANSETRON HCL 4 MG/2ML IJ SOLN
INTRAMUSCULAR | Status: AC
  Filled 2016-08-16: qty 2

## 2016-08-16 MED ORDER — SCOPOLAMINE 1 MG/3DAYS TD PT72
MEDICATED_PATCH | TRANSDERMAL | Status: AC
  Administered 2016-08-16: 1.5 mg via TRANSDERMAL
  Filled 2016-08-16: qty 1

## 2016-08-16 MED ORDER — CEFAZOLIN SODIUM-DEXTROSE 2-4 GM/100ML-% IV SOLN
2.0000 g | INTRAVENOUS | Status: AC
  Administered 2016-08-16: 2 g via INTRAVENOUS

## 2016-08-16 MED ORDER — SUGAMMADEX SODIUM 200 MG/2ML IV SOLN
INTRAVENOUS | Status: AC
  Filled 2016-08-16: qty 2

## 2016-08-16 MED ORDER — ROCURONIUM BROMIDE 100 MG/10ML IV SOLN
INTRAVENOUS | Status: DC | PRN
  Administered 2016-08-16: 40 mg via INTRAVENOUS
  Administered 2016-08-16: 10 mg via INTRAVENOUS

## 2016-08-16 MED ORDER — DEXAMETHASONE SODIUM PHOSPHATE 4 MG/ML IJ SOLN
INTRAMUSCULAR | Status: AC
  Filled 2016-08-16: qty 1

## 2016-08-16 MED ORDER — ONDANSETRON HCL 4 MG/2ML IJ SOLN
INTRAMUSCULAR | Status: DC | PRN
  Administered 2016-08-16: 4 mg via INTRAVENOUS

## 2016-08-16 MED ORDER — FENTANYL CITRATE (PF) 250 MCG/5ML IJ SOLN
INTRAMUSCULAR | Status: AC
  Filled 2016-08-16: qty 5

## 2016-08-16 MED ORDER — MIDAZOLAM HCL 2 MG/2ML IJ SOLN
INTRAMUSCULAR | Status: DC | PRN
  Administered 2016-08-16: 2 mg via INTRAVENOUS

## 2016-08-16 MED ORDER — ACETAMINOPHEN 325 MG PO TABS: 325.0000 mg | ORAL_TABLET | ORAL | Status: DC | PRN

## 2016-08-16 MED ORDER — ACETAMINOPHEN 160 MG/5ML PO SOLN: 325.0000 mg | ORAL | Status: DC | PRN

## 2016-08-16 MED ORDER — BUPIVACAINE HCL (PF) 0.25 % IJ SOLN
INTRAMUSCULAR | Status: DC | PRN
  Administered 2016-08-16: 5 mL

## 2016-08-16 MED ORDER — PROPOFOL 10 MG/ML IV BOLUS
INTRAVENOUS | Status: AC
  Filled 2016-08-16: qty 20

## 2016-08-16 MED ORDER — SCOPOLAMINE 1 MG/3DAYS TD PT72
1.0000 | MEDICATED_PATCH | Freq: Once | TRANSDERMAL | Status: DC
  Administered 2016-08-16: 1.5 mg via TRANSDERMAL

## 2016-08-16 MED ORDER — LIDOCAINE HCL (CARDIAC) 20 MG/ML IV SOLN
INTRAVENOUS | Status: AC
  Filled 2016-08-16: qty 5

## 2016-08-16 MED ORDER — KETOROLAC TROMETHAMINE 30 MG/ML IJ SOLN
INTRAMUSCULAR | Status: DC | PRN
  Administered 2016-08-16: 30 mg via INTRAVENOUS

## 2016-08-16 MED ORDER — KETOROLAC TROMETHAMINE 30 MG/ML IJ SOLN: 30.0000 mg | Freq: Once | INTRAMUSCULAR | Status: DC

## 2016-08-16 MED ORDER — DEXAMETHASONE SODIUM PHOSPHATE 10 MG/ML IJ SOLN
INTRAMUSCULAR | Status: DC | PRN
  Administered 2016-08-16: 4 mg via INTRAVENOUS

## 2016-08-16 MED ORDER — BUPIVACAINE HCL (PF) 0.25 % IJ SOLN
INTRAMUSCULAR | Status: AC
  Filled 2016-08-16: qty 30

## 2016-08-16 SURGICAL SUPPLY — 30 items
CABLE HIGH FREQUENCY MONO STRZ (ELECTRODE) IMPLANT
CATH ROBINSON RED A/P 16FR (CATHETERS) ×2 IMPLANT
CLOTH BEACON ORANGE TIMEOUT ST (SAFETY) ×2 IMPLANT
DRSG COVADERM PLUS 2X2 (GAUZE/BANDAGES/DRESSINGS) IMPLANT
DRSG OPSITE POSTOP 3X4 (GAUZE/BANDAGES/DRESSINGS) ×2 IMPLANT
DURAPREP 26ML APPLICATOR (WOUND CARE) ×2 IMPLANT
FILTER SMOKE EVAC LAPAROSHD (FILTER) ×2 IMPLANT
FORCEPS CUTTING 33CM 5MM (CUTTING FORCEPS) IMPLANT
FORCEPS CUTTING 45CM 5MM (CUTTING FORCEPS) IMPLANT
GLOVE BIO SURGEON STRL SZ7 (GLOVE) ×2 IMPLANT
GLOVE BIOGEL PI IND STRL 7.0 (GLOVE) ×4 IMPLANT
GLOVE BIOGEL PI INDICATOR 7.0 (GLOVE) ×4
GOWN STRL REUS W/TWL LRG LVL3 (GOWN DISPOSABLE) ×6 IMPLANT
LIQUID BAND (GAUZE/BANDAGES/DRESSINGS) ×2 IMPLANT
MANIPULATOR UTERINE 4.5 ZUMI (MISCELLANEOUS) ×2 IMPLANT
NS IRRIG 1000ML POUR BTL (IV SOLUTION) IMPLANT
PACK LAPAROSCOPY BASIN (CUSTOM PROCEDURE TRAY) ×2 IMPLANT
PAD TRENDELENBURG POSITION (MISCELLANEOUS) ×2 IMPLANT
POUCH SPECIMEN RETRIEVAL 10MM (ENDOMECHANICALS) IMPLANT
SCISSORS LAP 5X35 DISP (ENDOMECHANICALS) ×2 IMPLANT
SET IRRIG TUBING LAPAROSCOPIC (IRRIGATION / IRRIGATOR) ×2 IMPLANT
SLEEVE XCEL OPT CAN 5 100 (ENDOMECHANICALS) ×2 IMPLANT
SOLUTION ELECTROLUBE (MISCELLANEOUS) ×2 IMPLANT
SUT VICRYL 0 UR6 27IN ABS (SUTURE) ×2 IMPLANT
SUT VICRYL 4-0 PS2 18IN ABS (SUTURE) ×2 IMPLANT
TOWEL OR 17X24 6PK STRL BLUE (TOWEL DISPOSABLE) ×4 IMPLANT
TROCAR BALLN 12MMX100 BLUNT (TROCAR) ×2 IMPLANT
TROCAR XCEL NON-BLD 5MMX100MML (ENDOMECHANICALS) ×2 IMPLANT
WARMER LAPAROSCOPE (MISCELLANEOUS) ×2 IMPLANT
WATER STERILE IRR 1000ML POUR (IV SOLUTION) ×2 IMPLANT

## 2016-08-16 NOTE — Discharge Instructions (Addendum)
DISCHARGE INSTRUCTIONS: Laparoscopy  The following instructions have been prepared to help you care for yourself upon your return home today.  Wound care:  Do not get the incision wet for the first 24 hours. The incision should be kept clean and dry.  The Band-Aids or dressings may be removed the day after surgery.  Should the incision become sore, red, and swollen after the first week, check with your doctor.  Personal hygiene:  Shower the day after your procedure.  Activity and limitations:  Do NOT drive or operate any equipment today.  Do NOT lift anything more than 15 pounds for 2-3 weeks after surgery.  Do NOT rest in bed all day.  Walking is encouraged. Walk each day, starting slowly with 5-minute walks 3 or 4 times a day. Slowly increase the length of your walks.  Walk up and down stairs slowly.  Do NOT do strenuous activities, such as golfing, playing tennis, bowling, running, biking, weight lifting, gardening, mowing, or vacuuming for 2-4 weeks. Ask your doctor when it is okay to start.  Diet: Eat a light meal as desired this evening. You may resume your usual diet tomorrow.  Return to work: This is dependent on the type of work you do. For the most part you can return to a desk job within a week of surgery. If you are more active at work, please discuss this with your doctor.  What to expect after your surgery: You may have a slight burning sensation when you urinate on the first day. You may have a very small amount of blood in the urine. Expect to have a small amount of vaginal discharge/light bleeding for 1-2 weeks. It is not unusual to have abdominal soreness and bruising for up to 2 weeks. You may be tired and need more rest for about 1 week. You may experience shoulder pain for 24-72 hours. Lying flat in bed may relieve it.  Call your doctor for any of the following:  Develop a fever of 100.4 or greater  Inability to urinate 6 hours after discharge from  hospital  Severe pain not relieved by pain medications  Persistent of heavy bleeding at incision site  Redness or swelling around incision site after a week  Increasing nausea or vomiting  Patient Signature________________________________________ Nurse Signature_________________________________________ Post Anesthesia Home Care Instructions  NO IBUPROFEN PRODUCTS UNTIL: 10:15 PM TONIGHT  Activity: Get plenty of rest for the remainder of the day. A responsible adult should stay with you for 24 hours following the procedure.  For the next 24 hours, DO NOT: -Drive a car -Paediatric nurse -Drink alcoholic beverages -Take any medication unless instructed by your physician -Make any legal decisions or sign important papers.  Meals: Start with liquid foods such as gelatin or soup. Progress to regular foods as tolerated. Avoid greasy, spicy, heavy foods. If nausea and/or vomiting occur, drink only clear liquids until the nausea and/or vomiting subsides. Call your physician if vomiting continues.  Special Instructions/Symptoms: Your throat may feel dry or sore from the anesthesia or the breathing tube placed in your throat during surgery. If this causes discomfort, gargle with warm salt water. The discomfort should disappear within 24 hours.  If you had a scopolamine patch placed behind your ear for the management of post- operative nausea and/or vomiting:  1. The medication in the patch is effective for 72 hours, after which it should be removed.  Wrap patch in a tissue and discard in the trash. Wash hands thoroughly with soap  and water. 2. You may remove the patch earlier than 72 hours if you experience unpleasant side effects which may include dry mouth, dizziness or visual disturbances. 3. Avoid touching the patch. Wash your hands with soap and water after contact with the patch.

## 2016-08-16 NOTE — Anesthesia Postprocedure Evaluation (Signed)
Anesthesia Post Note  Patient: Nicole Kindred  Procedure(s) Performed: Procedure(s) (LRB): LAPAROSCOPIC OVARIAN CYSTECTOMY (Left)  Patient location during evaluation: PACU Anesthesia Type: General Level of consciousness: awake and alert Pain management: pain level controlled Vital Signs Assessment: post-procedure vital signs reviewed and stable Respiratory status: spontaneous breathing, nonlabored ventilation, respiratory function stable and patient connected to nasal cannula oxygen Cardiovascular status: blood pressure returned to baseline and stable Postop Assessment: no signs of nausea or vomiting Anesthetic complications: no     Last Vitals:  Vitals:   08/16/16 1730 08/16/16 1745  BP: 106/71 114/75  Pulse: 86 93  Resp: 12 13  Temp:  36.6 C    Last Pain:  Vitals:   08/16/16 1745  TempSrc:   PainSc: 0-No pain   Pain Goal: Patients Stated Pain Goal: 3 (08/16/16 1745)               Kida Digiulio J

## 2016-08-16 NOTE — Anesthesia Procedure Notes (Signed)
Procedure Name: Intubation Date/Time: 08/16/2016 2:33 PM Performed by: Hewitt Blade Pre-anesthesia Checklist: Patient identified, Emergency Drugs available, Suction available and Patient being monitored Patient Re-evaluated:Patient Re-evaluated prior to inductionOxygen Delivery Method: Circle system utilized Preoxygenation: Pre-oxygenation with 100% oxygen Intubation Type: IV induction Ventilation: Mask ventilation without difficulty Laryngoscope Size: Mac and 3 Grade View: Grade I Tube type: Oral Tube size: 7.0 mm Number of attempts: 1 Airway Equipment and Method: Stylet Placement Confirmation: ETT inserted through vocal cords under direct vision,  positive ETCO2 and breath sounds checked- equal and bilateral Secured at: 22 cm Tube secured with: Tape Dental Injury: Teeth and Oropharynx as per pre-operative assessment

## 2016-08-16 NOTE — H&P (Addendum)
Robin Griffin is an 25 y.o. female is here for laparoscopic left ovarian cystectomy, suspected dermoid cyst. No complaints. It was noted at routine sonogram in pregnancy, has grown by 1 cm since.  Menses normal. Normal Paps. No breast complaints. One term SVD. BC- none, abstains. LMP today.  IUD removed at last visit in Aug'16 due to irreg and heavy menses. .    Past Medical History:  Diagnosis Date  . Headache    migraines stress related while in college  . Seizures ([place])    childhood with fever    No past surgical history on file.  No family history on file.  Social History:  reports that she has never smoked. She has never used smokeless tobacco. She reports that she does not drink alcohol or use drugs.  Allergies: No Known Allergies  No prescriptions prior to admission.    ROS neg   Last menstrual period 07/25/2016, unknown if currently breastfeeding. Physical Exam A&O x 3, no acute distress. Pleasant HEENT neg, no thyromegaly Lungs CTA bilat CV RRR, S1S2 normal Abdo soft, non tender, non acute Extr no edema/ tenderness Pelvic Normal uterus and cervix.  Left ovary 5 cm with complex mass c/w dermoid. Normal right ovary.   No results found for this or any previous visit (from the past 24 hour(s)).  UPT negative   Assessment/Plan: 25 yo G1P1. Left ovarian dermoid about 5 cm. Here for laparoscopic left ovarian cystectomy and possible oophorectomy.  Risks/complications of surgery reviewed incl infection, bleeding, damage to internal organs including bladder, bowels, ureters, blood vessels, other risks from anesthesia, VTE and delayed complications of any surgery, complications in future surgery reviewed.   Takeru Bose R 08/16/2016, 10:22 AM

## 2016-08-16 NOTE — Anesthesia Preprocedure Evaluation (Signed)
Anesthesia Evaluation  Patient identified by MRN, date of birth, ID band Patient awake    Reviewed: Allergy & Precautions, H&P , NPO status , Patient's Chart, lab work & pertinent test results  Airway Mallampati: I  TM Distance: >3 FB Neck ROM: full    Dental no notable dental hx. (+) Teeth Intact   Pulmonary neg pulmonary ROS,    Pulmonary exam normal        Cardiovascular negative cardio ROS Normal cardiovascular exam     Neuro/Psych negative psych ROS   GI/Hepatic negative GI ROS, Neg liver ROS,   Endo/Other  negative endocrine ROS  Renal/GU negative Renal ROS     Musculoskeletal   Abdominal Normal abdominal exam  (+)   Peds  Hematology negative hematology ROS (+)   Anesthesia Other Findings   Reproductive/Obstetrics negative OB ROS                             Anesthesia Physical Anesthesia Plan  ASA: I  Anesthesia Plan: General   Post-op Pain Management:    Induction: Intravenous  Airway Management Planned: Oral ETT  Additional Equipment:   Intra-op Plan:   Post-operative Plan: Extubation in OR  Informed Consent: I have reviewed the patients History and Physical, chart, labs and discussed the procedure including the risks, benefits and alternatives for the proposed anesthesia with the patient or authorized representative who has indicated his/her understanding and acceptance.   Dental Advisory Given  Plan Discussed with: CRNA and Surgeon  Anesthesia Plan Comments:         Anesthesia Quick Evaluation

## 2016-08-16 NOTE — Transfer of Care (Signed)
Immediate Anesthesia Transfer of Care Note  Patient: Nicole Kindred  Procedure(s) Performed: Procedure(s): LAPAROSCOPIC OVARIAN CYSTECTOMY (Left)  Patient Location: PACU  Anesthesia Type:General  Level of Consciousness: awake, alert  and oriented  Airway & Oxygen Therapy: Patient Spontanous Breathing and Patient connected to nasal cannula oxygen  Post-op Assessment: Report given to RN, Post -op Vital signs reviewed and stable and Patient moving all extremities  Post vital signs: Reviewed and stable  Last Vitals:  Vitals:   08/16/16 1212  BP: 114/78  Pulse: 89  Resp: 16  Temp: 36.7 C    Last Pain:  Vitals:   08/16/16 1212  TempSrc: Oral      Patients Stated Pain Goal: 3 ([id number])  Complications: No apparent anesthesia complications

## 2016-08-16 NOTE — Op Note (Signed)
Preoperative diagnosis: Left ovarian complex cyst Postoperative diagnosis: Left ovarian dermoid cyst  Procedure: Laparoscopic left ovarian cystectomy Surgeon: Dr Azucena Fallen, MD Assistants: Artelia Laroche, CNM Anesthesia Gen. Endotracheal IV fluids: 1600 cc LR EBL 25 cc Urine clear : foley [id number]  Complications none Disposition PACU and home Specimens: Ovarian cyst wall   Procedure Patient is 25 yo, G1 P1, with persistent left ovarian complex cyst noted since over 1 year and suspected dermoid cyst by sonogram. She is here for laparoscopic ovarian cystectomy due to occasional pain and concern for torsion or need for emergency surgery. Risk and complications of surgery including infection, bleeding, damage to internal organs, other complications including pneumonia, VTE were reviewed. Patient voiced understanding. Informed written consent was obtained and patient was brought to the operating room with IV running. Timeout was carried out. She underwent general anesthesia without difficulty and was given dorsal lithotomy position.  Examination under anesthesia revealed anteverted normal size uterus and left adnexal 5 cm cyst. Parts prepped and draped in standard fashion. Bladder was emptied with foley, clear urine. Speculum was placed anterior lip of cervix was grasped with tenaculum, uterus was sounded to 8 cm, Zumi manipulator was introduced in the uterine cavity and secured with balloon and tenaculum, speculum removed.  Gloves, gown changed attention was focused on the abdomen. A 10 mm vertical incision was made at the umbilicus after injecting Marcaine. Incision was carried down to the fascia was incised peritoneal entry was made and confirmed. Hassan cannula with balloon was introduced and secured. Insufflation was begun with CO2. Patient was given Trendelenburg position. A 0 laparoscope was introduced. Internal organs appear normal including normal bowel appendix normal uterus, tubes and right  ovary. LEFT enlarged ovary with cyst noted. No bleeding was noted. 2 lower ports placed from 5 mm incisions in each lower quadrant under vision after injecting marcaine. Ovary stabilized and ovarian capsule incised with endoscissors. Cheesy material and hair noted coming out, incision extended about 3 cm. Ovarian cyst wall identified and grasped and assistant grasped ovarian tissue. Ovarian cyst emptied using large suction irrigator and cyst was dissected entire and peeled out of the ovary and specimen removed from the central port. Ovarian bed was hemostatic. Good amount of time was spent removing all the cyst material and hair while irrigating pelvic and abdominal cavity thoroughly. Hemostasis of ovarian bed achieved with cautery using endoscissors. Hemostasis excellent.  All instruments removed and pneumoperitoneum deflated. Central port incision closed using 0Vicryl on fascia and 3-0 Vicryl on skin. Lateral lower ports closed with 3-0 Vicryl on skin followed by Dermabond. Vaginal instruments removed. Hemostasis was excellent. Instruments were removed.  All counts were correct x2 patient was reversed from general anesthesia extubated and brought out to the recovery room in stable condition. Plan is to discharge home from recovery room. Surgical findings were discussed with patient's family. Followup with Dr. Benjie Karvonen in office in 2 weeks.

## 2016-08-17 ENCOUNTER — Encounter (HOSPITAL_COMMUNITY): Payer: Self-pay | Admitting: Obstetrics & Gynecology

## 2016-11-03 ENCOUNTER — Ambulatory Visit (INDEPENDENT_AMBULATORY_CARE_PROVIDER_SITE_OTHER): Payer: 59 | Admitting: Physician Assistant

## 2016-11-03 ENCOUNTER — Encounter: Payer: Self-pay | Admitting: Physician Assistant

## 2016-11-03 VITALS — BP 100/60 | HR 82 | Temp 98.5°F | Resp 16 | Ht 61.0 in | Wt 125.4 lb

## 2016-11-03 DIAGNOSIS — Z0000 Encounter for general adult medical examination without abnormal findings: Secondary | ICD-10-CM | POA: Diagnosis not present

## 2016-11-03 DIAGNOSIS — Z23 Encounter for immunization: Secondary | ICD-10-CM

## 2016-11-03 DIAGNOSIS — Z131 Encounter for screening for diabetes mellitus: Secondary | ICD-10-CM

## 2016-11-03 DIAGNOSIS — L308 Other specified dermatitis: Secondary | ICD-10-CM

## 2016-11-03 DIAGNOSIS — Z136 Encounter for screening for cardiovascular disorders: Secondary | ICD-10-CM | POA: Diagnosis not present

## 2016-11-03 DIAGNOSIS — Z13220 Encounter for screening for lipoid disorders: Secondary | ICD-10-CM | POA: Diagnosis not present

## 2016-11-03 MED ORDER — TRIAMCINOLONE ACETONIDE 0.1 % EX CREA: 1.0000 "application " | TOPICAL_CREAM | Freq: Two times a day (BID) | CUTANEOUS | 3 refills | Status: DC

## 2016-11-03 NOTE — Progress Notes (Signed)
Patient: Robin Griffin, Female    DOB: [birth year], 25 y.o.   MRN: [id number] Visit Date: 11/03/2016  Today's Provider: Mar Daring, PA-C   Chief Complaint  Patient presents with  . Annual Exam   Subjective:    Annual physical exam Robin Griffin is a 25 y.o. female who presents today for health maintenance and complete physical. She feels well. She reports exercising some. She reports she is sleeping well.  She is concern about a rash she has developed in her hands and between her fingers.  Patient had laparoscopic ovarian cystectomy surgery 08/16/16. She is doing well postoperatively and has had no issues.  Pap-11/03/2015 Negative-Repeat in 3 years -----------------------------------------------------------------   Review of Systems  Constitutional: Negative.   HENT: Negative.   Eyes: Negative.   Respiratory: Negative.   Cardiovascular: Negative.   Gastrointestinal: Negative.   Endocrine: Negative.   Genitourinary: Negative.   Musculoskeletal: Negative.   Skin: Positive for rash.  Allergic/Immunologic: Negative.   Neurological: Positive for headaches.  Hematological: Negative.   Psychiatric/Behavioral: Negative.     Social History      She  reports that she has never smoked. She has never used smokeless tobacco. She reports that she does not drink alcohol or use drugs.       Social History   Social History  . Marital status: Married    Spouse name: N/A  . Number of children: N/A  . Years of education: N/A   Social History Main Topics  . Smoking status: Never Smoker  . Smokeless tobacco: Never Used  . Alcohol use No  . Drug use: No  . Sexual activity: Yes   Other Topics Concern  . None   Social History Narrative  . None    Past Medical History:  Diagnosis Date  . Dermoid cyst of left ovary 08/16/2016  . Headache    migraines stress related while in college  . Seizures ([place])    childhood with fever     Patient Active  Problem List   Diagnosis Date Noted  . Dermoid cyst of left ovary 08/16/2016  . SVD (spontaneous vaginal delivery) 04/14/2015  . Postpartum care following vaginal delivery (5/16) 04/14/2015  . Active labor at term 04/13/2015    Past Surgical History:  Procedure Laterality Date  . LAPAROSCOPIC OVARIAN CYSTECTOMY Left 08/16/2016   Procedure: LAPAROSCOPIC OVARIAN CYSTECTOMY;  Surgeon: Azucena Fallen, MD;  Location: [facility];  Service: Gynecology;  Laterality: Left;    Family History        Family Status  Relation Status  . Mother Alive  . Father Alive  . Sister Alive  . Brother Alive        Her family history is not on file.     No Known Allergies   Current Outpatient Prescriptions:  .  ibuprofen (ADVIL) 200 MG tablet, Take 3 tablets (600 mg total) by mouth every 6 (six) hours as needed., Disp: 30 tablet, Rfl: 0 .  oxyCODONE-acetaminophen (ROXICET) 5-325 MG tablet, Take 1 tablet by mouth every 6 (six) hours as needed for severe pain. (Patient not taking: Reported on 11/03/2016), Disp: 20 tablet, Rfl: 0   Patient Care Team: Mar Daring, PA-C as PCP - General (Family Medicine)      Objective:   Vitals: BP 100/60 (BP Location: Right Arm, Patient Position: Sitting, Cuff Size: Normal)   Pulse 82   Temp 98.5 F (36.9 C) (Oral)   Resp 16   Ht 5'  1" (1.549 m)   Wt 125 lb 6.4 oz (56.9 kg)   LMP 10/11/2016   BMI 23.69 kg/m    Physical Exam  Constitutional: She is oriented to person, place, and time. She appears well-developed and well-nourished. No distress.  HENT:  Head: Normocephalic and atraumatic.  Right Ear: Tympanic membrane, external ear and ear canal normal.  Left Ear: Tympanic membrane, external ear and ear canal normal.  Nose: Nose normal.  Mouth/Throat: Uvula is midline, oropharynx is clear and moist and mucous membranes are normal. No oropharyngeal exudate.  Eyes: Conjunctivae and EOM are normal. Pupils are equal, round, and reactive to light. Right eye  exhibits no discharge. Left eye exhibits no discharge. No scleral icterus.  Neck: Normal range of motion. Neck supple. No JVD present. No tracheal deviation present. No thyromegaly present.  Cardiovascular: Normal rate, regular rhythm, normal heart sounds and intact distal pulses.  Exam reveals no gallop and no friction rub.   No murmur heard. Pulmonary/Chest: Effort normal and breath sounds normal. No respiratory distress. She has no wheezes. She has no rales. She exhibits no tenderness. Right breast exhibits no inverted nipple, no mass, no nipple discharge, no skin change and no tenderness. Left breast exhibits no inverted nipple, no mass, no nipple discharge, no skin change and no tenderness. Breasts are symmetrical.  Abdominal: Soft. Bowel sounds are normal. She exhibits no distension and no mass. There is no tenderness. There is no rebound and no guarding.  Musculoskeletal: Normal range of motion. She exhibits no edema or tenderness.  Lymphadenopathy:    She has no cervical adenopathy.  Neurological: She is alert and oriented to person, place, and time.  Skin: Skin is warm and dry. No rash noted. She is not diaphoretic.  Psychiatric: She has a normal mood and affect. Her behavior is normal. Judgment and thought content normal.  Vitals reviewed.    Depression Screen No flowsheet data found.    Assessment & Plan:     Routine Health Maintenance and Physical Exam  Exercise Activities and Dietary recommendations Goals    None      Immunization History  Administered Date(s) Administered  . Influenza-Unspecified 10/06/2015    Health Maintenance  Topic Date Due  . TETANUS/TDAP  05/07/2008  . INFLUENZA VACCINE  06/28/2016  . PAP SMEAR  11/02/2018  . HIV Screening  Completed     Discussed health benefits of physical activity, and encouraged her to engage in regular exercise appropriate for her age and condition.    1. Annual physical exam Normal physical exam today. Will  check labs as below and f/u pending lab results. If labs are stable and WNL she will not need to have these rechecked for one year at her next annual physical exam. She is to call the office in the meantime if she has any acute issue, questions or concerns. - CBC with Differential/Platelet - Comprehensive metabolic panel - Lipid panel  2. Influenza vaccine needed Flu vaccine given today without complication. Patient sat upright for 15 minutes to check for adverse reaction before being released. - Flu Vaccine QUAD 36+ mos IM  3. Encounter for lipid screening for cardiovascular disease Will check labs as below and f/u pending results.  - Lipid panel - TSH  4. Special screening examination for diabetes mellitus Will check labs as below and f/u pending results. - Hemoglobin A1c  5. Other eczema Eczema noted on the right hand mostly around the joints. Discussed this is most likely secondary to her  using hot water and hand sensitizer is due to trying to keep things clean for her child. We'll give triamcinolone cream as below for her to use on affected areas as needed. - triamcinolone cream (KENALOG) 0.1 %; Apply 1 application topically 2 (two) times daily.  Dispense: 30 g; Refill: 3  --------------------------------------------------------------------    Mar Daring, PA-C  [facility]

## 2016-11-03 NOTE — Patient Instructions (Signed)
Health Maintenance, Female Introduction Adopting a healthy lifestyle and getting preventive care can go a long way to promote health and wellness. Talk with your health care provider about what schedule of regular examinations is right for you. This is a good chance for you to check in with your provider about disease prevention and staying healthy. In between checkups, there are plenty of things you can do on your own. Experts have done a lot of research about which lifestyle changes and preventive measures are most likely to keep you healthy. Ask your health care provider for more information. Weight and diet Eat a healthy diet  Be sure to include plenty of vegetables, fruits, low-fat dairy products, and lean protein.  Do not eat a lot of foods high in solid fats, added sugars, or salt.  Get regular exercise. This is one of the most important things you can do for your health.  Most adults should exercise for at least 150 minutes each week. The exercise should increase your heart rate and make you sweat (moderate-intensity exercise).  Most adults should also do strengthening exercises at least twice a week. This is in addition to the moderate-intensity exercise. Maintain a healthy weight  Body mass index (BMI) is a measurement that can be used to identify possible weight problems. It estimates body fat based on height and weight. Your health care provider can help determine your BMI and help you achieve or maintain a healthy weight.  For females 63 years of age and older:  A BMI below 18.5 is considered underweight.  A BMI of 18.5 to 24.9 is normal.  A BMI of 25 to 29.9 is considered overweight.  A BMI of 30 and above is considered obese. Watch levels of cholesterol and blood lipids  You should start having your blood tested for lipids and cholesterol at 27 years of age, then have this test every 5 years.  You may need to have your cholesterol levels checked more often if:  Your  lipid or cholesterol levels are high.  You are older than 27 years of age.  You are at high risk for heart disease. Cancer screening Lung Cancer  Lung cancer screening is recommended for adults 56-22 years old who are at high risk for lung cancer because of a history of smoking.  A yearly low-dose CT scan of the lungs is recommended for people who:  Currently smoke.  Have quit within the past 15 years.  Have at least a 30-pack-year history of smoking. A pack year is smoking an average of one pack of cigarettes a day for 1 year.  Yearly screening should continue until it has been 15 years since you quit.  Yearly screening should stop if you develop a health problem that would prevent you from having lung cancer treatment. Breast Cancer  Practice breast self-awareness. This means understanding how your breasts normally appear and feel.  It also means doing regular breast self-exams. Let your health care provider know about any changes, no matter how small.  If you are in your 20s or 30s, you should have a clinical breast exam (CBE) by a health care provider every 1-3 years as part of a regular health exam.  If you are 35 or older, have a CBE every year. Also consider having a breast X-ray (mammogram) every year.  If you have a family history of breast cancer, talk to your health care provider about genetic screening.  If you are at high risk for breast cancer,  talk to your health care provider about having an MRI and a mammogram every year.  Breast cancer gene (BRCA) assessment is recommended for women who have family members with BRCA-related cancers. BRCA-related cancers include:  Breast.  Ovarian.  Tubal.  Peritoneal cancers.  Results of the assessment will determine the need for genetic counseling and BRCA1 and BRCA2 testing. Cervical Cancer  Your health care provider may recommend that you be screened regularly for cancer of the pelvic organs (ovaries, uterus, and  vagina). This screening involves a pelvic examination, including checking for microscopic changes to the surface of your cervix (Pap test). You may be encouraged to have this screening done every 3 years, beginning at age 21.  For women ages 30-65, health care providers may recommend pelvic exams and Pap testing every 3 years, or they may recommend the Pap and pelvic exam, combined with testing for human papilloma virus (HPV), every 5 years. Some types of HPV increase your risk of cervical cancer. Testing for HPV may also be done on women of any age with unclear Pap test results.  Other health care providers may not recommend any screening for nonpregnant women who are considered low risk for pelvic cancer and who do not have symptoms. Ask your health care provider if a screening pelvic exam is right for you.  If you have had past treatment for cervical cancer or a condition that could lead to cancer, you need Pap tests and screening for cancer for at least 20 years after your treatment. If Pap tests have been discontinued, your risk factors (such as having a new sexual partner) need to be reassessed to determine if screening should resume. Some women have medical problems that increase the chance of getting cervical cancer. In these cases, your health care provider may recommend more frequent screening and Pap tests. Colorectal Cancer  This type of cancer can be detected and often prevented.  Routine colorectal cancer screening usually begins at 27 years of age and continues through 27 years of age.  Your health care provider may recommend screening at an earlier age if you have risk factors for colon cancer.  Your health care provider may also recommend using home test kits to check for hidden blood in the stool.  A small camera at the end of a tube can be used to examine your colon directly (sigmoidoscopy or colonoscopy). This is done to check for the earliest forms of colorectal  cancer.  Routine screening usually begins at age 50.  Direct examination of the colon should be repeated every 5-10 years through 27 years of age. However, you may need to be screened more often if early forms of precancerous polyps or small growths are found. Skin Cancer  Check your skin from head to toe regularly.  Tell your health care provider about any new moles or changes in moles, especially if there is a change in a mole's shape or color.  Also tell your health care provider if you have a mole that is larger than the size of a pencil eraser.  Always use sunscreen. Apply sunscreen liberally and repeatedly throughout the day.  Protect yourself by wearing long sleeves, pants, a wide-brimmed hat, and sunglasses whenever you are outside. Heart disease, diabetes, and high blood pressure  High blood pressure causes heart disease and increases the risk of stroke. High blood pressure is more likely to develop in:  People who have blood pressure in the high end of the normal range (130-139/85-89 mm Hg).    People who are overweight or obese.  People who are African American.  If you are 18-39 years of age, have your blood pressure checked every 3-5 years. If you are 40 years of age or older, have your blood pressure checked every year. You should have your blood pressure measured twice-once when you are at a hospital or clinic, and once when you are not at a hospital or clinic. Record the average of the two measurements. To check your blood pressure when you are not at a hospital or clinic, you can use:  An automated blood pressure machine at a pharmacy.  A home blood pressure monitor.  If you are between 55 years and 79 years old, ask your health care provider if you should take aspirin to prevent strokes.  Have regular diabetes screenings. This involves taking a blood sample to check your fasting blood sugar level.  If you are at a normal weight and have a low risk for diabetes,  have this test once every three years after 27 years of age.  If you are overweight and have a high risk for diabetes, consider being tested at a younger age or more often. Preventing infection Hepatitis B  If you have a higher risk for hepatitis B, you should be screened for this virus. You are considered at high risk for hepatitis B if:  You were born in a country where hepatitis B is common. Ask your health care provider which countries are considered high risk.  Your parents were born in a high-risk country, and you have not been immunized against hepatitis B (hepatitis B vaccine).  You have HIV or AIDS.  You use needles to inject street drugs.  You live with someone who has hepatitis B.  You have had sex with someone who has hepatitis B.  You get hemodialysis treatment.  You take certain medicines for conditions, including cancer, organ transplantation, and autoimmune conditions. Hepatitis C  Blood testing is recommended for:  Everyone born from 1945 through 1965.  Anyone with known risk factors for hepatitis C. Sexually transmitted infections (STIs)  You should be screened for sexually transmitted infections (STIs) including gonorrhea and chlamydia if:  You are sexually active and are younger than 27 years of age.  You are older than 27 years of age and your health care provider tells you that you are at risk for this type of infection.  Your sexual activity has changed since you were last screened and you are at an increased risk for chlamydia or gonorrhea. Ask your health care provider if you are at risk.  If you do not have HIV, but are at risk, it may be recommended that you take a prescription medicine daily to prevent HIV infection. This is called pre-exposure prophylaxis (PrEP). You are considered at risk if:  You are sexually active and do not regularly use condoms or know the HIV status of your partner(s).  You take drugs by injection.  You are sexually  active with a partner who has HIV. Talk with your health care provider about whether you are at high risk of being infected with HIV. If you choose to begin PrEP, you should first be tested for HIV. You should then be tested every 3 months for as long as you are taking PrEP. Pregnancy  If you are premenopausal and you may become pregnant, ask your health care provider about preconception counseling.  If you may become pregnant, take 400 to 800 micrograms (mcg) of folic acid   every day.  If you want to prevent pregnancy, talk to your health care provider about birth control (contraception). Osteoporosis and menopause  Osteoporosis is a disease in which the bones lose minerals and strength with aging. This can result in serious bone fractures. Your risk for osteoporosis can be identified using a bone density scan.  If you are 51 years of age or older, or if you are at risk for osteoporosis and fractures, ask your health care provider if you should be screened.  Ask your health care provider whether you should take a calcium or vitamin D supplement to lower your risk for osteoporosis.  Menopause may have certain physical symptoms and risks.  Hormone replacement therapy may reduce some of these symptoms and risks. Talk to your health care provider about whether hormone replacement therapy is right for you. Follow these instructions at home:  Schedule regular health, dental, and eye exams.  Stay current with your immunizations.  Do not use any tobacco products including cigarettes, chewing tobacco, or electronic cigarettes.  If you are pregnant, do not drink alcohol.  If you are breastfeeding, limit how much and how often you drink alcohol.  Limit alcohol intake to no more than 1 drink per day for nonpregnant women. One drink equals 12 ounces of beer, 5 ounces of wine, or 1 ounces of hard liquor.  Do not use street drugs.  Do not share needles.  Ask your health care provider for  help if you need support or information about quitting drugs.  Tell your health care provider if you often feel depressed.  Tell your health care provider if you have ever been abused or do not feel safe at home. This information is not intended to replace advice given to you by your health care provider. Make sure you discuss any questions you have with your health care provider. Document Released: 05/30/2011 Document Revised: 04/21/2016 Document Reviewed: 08/18/2015  2017 Elsevier   Atopic Dermatitis Atopic dermatitis is a skin disorder that causes inflammation of the skin. This is the most common type of eczema. Eczema is a group of skin conditions that cause the skin to be itchy, red, and swollen. This condition is generally worse during the cooler winter months and often improves during the warm summer months. Symptoms can vary from person to person. Atopic dermatitis usually starts showing signs in infancy and can last through adulthood. This condition cannot be passed from one person to another (non-contagious), but is more common in families. Atopic dermatitis may not always be present. When it is present, it is called a flare-up. What are the causes? The exact cause of this condition is not known. Flare-ups of the condition may be triggered by:  Contact with something you are sensitive or allergic to.  Stress.  Certain foods.  Extremely hot or cold weather.  Harsh chemicals and soaps.  Dry air.  Chlorine. What increases the risk? This condition is more likely to develop in people who have a personal history or family history of eczema, allergies, asthma, or hay fever. What are the signs or symptoms? Symptoms of this condition include:  Dry, scaly skin.  Red, itchy rash.  Itchiness, which can be severe. This may occur before the skin rash. This can make sleeping difficult.  Skin thickening and cracking can occur over time. How is this diagnosed? This condition is  diagnosed based on your symptoms, a medical history, and a physical exam. How is this treated? There is no cure for this  condition, but symptoms can usually be controlled. Treatment focuses on:  Controlling the itching and scratching. You may be given medicines, such as antihistamines or steroid creams.  Limiting exposure to things that you are sensitive or allergic to (allergens).  Recognizing situations that cause stress and developing a plan to manage stress. If your atopic dermatitis does not get better with medicines or is all over your body (widespread) , a treatment using a specific type of light (phototherapy) may be used. Follow these instructions at home: Skin care  Keep your skin well-moisturized. This seals in moisture and help prevent dryness.  Use unscented lotions that have petroleum in them.  Avoid lotions that contain alcohol and water. They can dry the skin.  Keep baths or showers short (less than 5 minutes) in warm water. Do not use hot water.  Use mild, unscented cleansers for bathing. Avoid soap and bubble bath.  Apply a moisturizer to your skin right after a bath or shower.   Do not apply anything to your skin without checking with your health care provider. General instructions  Dress in clothes made of cotton or cotton blends. Dress lightly because heat increases itching.  When washing your clothes, rinse your clothes twice so all of the soap is removed.  Avoid any triggers that can cause a flare-up.  Try to manage your stress.  Keep your fingernails cut short.  Avoid scratching. Scratching makes the rash and itching worse. It may also result in a skin infection (impetigo) due to a break in the skin caused by scratching.  Take or apply over-the-counter and prescription medicines only as told by your health care provider.  Keep all follow-up visits as told by your health care provider. This is important.  Do not be around people who have cold sores  or fever blisters. If you get the infection, it may cause your atopic dermatitis to worsen. Contact a health care provider if:  Your itching interferes with sleep.  Your rash gets worse or is not better within one week of starting treatment.  You have a fever.  You have a rash flare-up after having contact with someone who has cold sores or fever blisters. Get help right away if:  You develop pus or soft yellow scabs in the rash area. Summary  This condition causes a red rash and itchy, dry, scaly skin.  Treatment focuses on controlling the itching and scratching, limiting exposure to things that you are sensitive or allergic to (allergens), and recognizing situations that cause stress and developing a plan to manage stress.  Keep your skin well-moisturized.  Keep baths or showers less than 5 minutes. This information is not intended to replace advice given to you by your health care provider. Make sure you discuss any questions you have with your health care provider. Document Released: 11/11/2000 Document Revised: 04/21/2016 Document Reviewed: 06/17/2013 Elsevier Interactive Patient Education  2017 [employer].

## 2016-11-08 ENCOUNTER — Ambulatory Visit (INDEPENDENT_AMBULATORY_CARE_PROVIDER_SITE_OTHER): Payer: 59 | Admitting: Physician Assistant

## 2016-11-08 ENCOUNTER — Encounter: Payer: Self-pay | Admitting: Physician Assistant

## 2016-11-08 VITALS — BP 106/66 | HR 88 | Temp 98.9°F | Resp 16 | Wt 125.0 lb

## 2016-11-08 DIAGNOSIS — J069 Acute upper respiratory infection, unspecified: Secondary | ICD-10-CM

## 2016-11-08 DIAGNOSIS — B9789 Other viral agents as the cause of diseases classified elsewhere: Secondary | ICD-10-CM | POA: Diagnosis not present

## 2016-11-08 NOTE — Patient Instructions (Signed)
Topical Mupirocin Ointment (bactroban) on Q-tip to rub on inside of nostril for dryness  Infeccin del tracto respiratorio superior, adultos (Upper Respiratory Infection, Adult) La mayora de las infecciones del tracto respiratorio superior estn causadas por un virus. Un infeccin del tracto respiratorio superior afecta la nariz, la garganta y las vas respiratorias superiores. El tipo ms comn de infeccin del tracto respiratorio superior es el resfro comn. [place] los medicamentos solamente como se lo haya indicado el mdico.  A fin de Best boy de garganta, haga grgaras con solucin salina templada o consuma caramelos para la tos, como se lo haya indicado el mdico.  Use un humidificador de vapor clido o inhale el vapor de la ducha para aumentar la humedad del aire. Esto facilitar la respiracin.  Beba suficiente lquido para mantener el pis (orina) claro o de color amarillo plido.  Tome sopas y caldos transparentes.  Siga una dieta saludable.  Descanse todo lo que sea necesario.  Regrese al [place] cuando la fiebre haya desaparecido o el mdico le diga que puede [place].  Es posible que deba quedarse en su casa durante un tiempo prolongado, para no transmitir la infeccin a los dems.  Ammon usar un barbijo y lavarse las manos con frecuencia para evitar el contagio del virus.  Si tiene asma, use el inhalador con mayor frecuencia.  No consuma ningn producto que contenga tabaco, lo que incluye cigarrillos, tabaco de Higher education careers adviser o Psychologist, sport and exercise. Si necesita ayuda para dejar de fumar, consulte al mdico. SOLICITE AYUDA SI:  Siente que empeora o que no mejora.  Los medicamentos no logran [employer].  Tiene escalofros.  La dificultad para Museum/gallery exhibitions officer.  Tiene mucosidad marrn o roja.  Tiene una secrecin amarilla o marrn de la Lawyer.  Le duele la cara, especialmente al inclinarse hacia adelante.  Tiene  fiebre.  Tiene los ganglios del cuello hinchados.  Siente dolor al tragar.  Tiene zonas blancas en la parte de atrs de la garganta. SOLICITE AYUDA DE INMEDIATO SI:  Los siguientes sntomas son muy intensos o constantes:  Dolor de [place].  Dolor de odos.  Dolor en la frente, detrs de los ojos y por encima de los pmulos (dolor sinusal).  Dolor en el pecho.  Tiene enfermedad pulmonar prolongada (crnica) y cualquiera de estos sntomas:  Sibilancias.  Tos prolongada.  Tos con sangre.  Cambio en la mucosidad habitual.  Presenta rigidez en el cuello.  Tiene cambios en:  La visin.  La audicin.  El pensamiento.  El [place] de nimo. ASEGRESE DE QUE:  Comprende estas instrucciones.  Controlar su afeccin.  Recibir ayuda de inmediato si no mejora o si empeora. Esta informacin no tiene Marine scientist el consejo del mdico. Asegrese de hacerle al mdico cualquier pregunta que tenga. Document Released: 04/18/2011 Document Revised: 03/31/2015 Document Reviewed: 02/19/2014 Elsevier Interactive Patient Education  2017 [employer].

## 2016-11-08 NOTE — Progress Notes (Signed)
Patient: Robin Griffin Female    DOB: 02-25-1991   25 y.o.   MRN: [id number] Visit Date: 11/08/2016  Today's Provider: Trinna Post, PA-C   Chief Complaint  Patient presents with  . URI   Subjective:    URI   This is a new problem. The current episode started yesterday. There has been no fever. Associated symptoms include congestion and a sore throat. Pertinent negatives include no ear pain, headaches, neck pain, plugged ear sensation, rhinorrhea, sinus pain or sneezing.   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 Her 70 mo old child is sick. She reports that her left nostril is congested and has only been breathing through her right nostril, which has been hurting her. Denies SOB or chest pain.     No Known Allergies  No current outpatient prescriptions on file.  Review of Systems  Constitutional: Negative.   HENT: Positive for congestion, sore throat, trouble swallowing and voice change. Negative for ear discharge, ear pain, hearing loss, mouth sores, nosebleeds, postnasal drip, rhinorrhea, sinus pain, sinus pressure, sneezing and tinnitus.   Eyes: Negative.   Respiratory: Negative.   Cardiovascular: Negative.   Gastrointestinal: Negative.   Musculoskeletal: Negative for arthralgias, back pain, gait problem, joint swelling, myalgias, neck pain and neck stiffness.  Neurological: Negative for dizziness, light-headedness and headaches.    Social History  Substance Use Topics  . Smoking status: Never Smoker  . Smokeless tobacco: Never Used  . Alcohol use No   Objective:   BP 106/66 (BP Location: Left Arm, Patient Position: Sitting, Cuff Size: Normal)   Pulse 88   Temp 98.9 F (37.2 C) (Oral)   Resp 16   Wt 125 lb (56.7 kg)   LMP 10/11/2016   BMI 23.62 kg/m   Physical Exam  Constitutional: She appears well-developed and well-nourished.  HENT:  Nose: No epistaxis. Right sinus exhibits no maxillary sinus tenderness and no  frontal sinus tenderness. Left sinus exhibits no maxillary sinus tenderness and no frontal sinus tenderness.  Mouth/Throat: Posterior oropharyngeal erythema present. No oropharyngeal exudate or posterior oropharyngeal edema.  Eyes: Conjunctivae are normal.  Neck: Neck supple.  Cardiovascular: Normal rate and regular rhythm.   Pulmonary/Chest: Effort normal and breath sounds normal.  Lymphadenopathy:    She has no cervical adenopathy.  Neurological: She is alert.  Skin: Skin is warm and dry.  Psychiatric: She has a normal mood and affect. Her behavior is normal.        Assessment & Plan:      Problem List Items Addressed This Visit    None    Visit Diagnoses    Viral upper respiratory illness    -  Primary     Patient is a 25 y/o female presenting with symptoms concerning for viral upper respiratory infection. Patient should use humidifier in room at night. Patient may use mupirocin ointment on Q-tip to moisten nare. Rest and fluids.   No Follow-up on file.   Patient Instructions  Topical Mupirocin Ointment (bactroban) on Q-tip to rub on inside of nostril for dryness  Infeccin del tracto respiratorio superior, adultos (Upper Respiratory Infection, Adult) La mayora de las infecciones del tracto respiratorio superior estn causadas por un virus. Un infeccin del tracto respiratorio superior afecta la nariz, la garganta y las vas respiratorias superiores. El tipo ms comn de infeccin del tracto respiratorio superior es el resfro comn. Rogers los medicamentos  solamente como se lo haya indicado el mdico.  A fin de Best boy de garganta, haga grgaras con solucin salina templada o consuma caramelos para la tos, como se lo haya indicado el mdico.  Use un humidificador de vapor clido o inhale el vapor de la ducha para aumentar la humedad del aire. Esto facilitar la respiracin.  Beba suficiente lquido para mantener el pis (orina) claro o de  color amarillo plido.  Tome sopas y caldos transparentes.  Siga una dieta saludable.  Descanse todo lo que sea necesario.  Regrese al [place] cuando la fiebre haya desaparecido o el mdico le diga que puede [place].  Es posible que deba quedarse en su casa durante un tiempo prolongado, para no transmitir la infeccin a los dems.  Superior usar un barbijo y lavarse las manos con frecuencia para evitar el contagio del virus.  Si tiene asma, use el inhalador con mayor frecuencia.  No consuma ningn producto que contenga tabaco, lo que incluye cigarrillos, tabaco de Higher education careers adviser o Psychologist, sport and exercise. Si necesita ayuda para dejar de fumar, consulte al mdico. SOLICITE AYUDA SI:  Siente que empeora o que no mejora.  Los medicamentos no logran [employer].  Tiene escalofros.  La dificultad para Museum/gallery exhibitions officer.  Tiene mucosidad marrn o roja.  Tiene una secrecin amarilla o marrn de la Lawyer.  Le duele la cara, especialmente al inclinarse hacia adelante.  Tiene fiebre.  Tiene los ganglios del cuello hinchados.  Siente dolor al tragar.  Tiene zonas blancas en la parte de atrs de la garganta. SOLICITE AYUDA DE INMEDIATO SI:  Los siguientes sntomas son muy intensos o constantes:  Dolor de [place].  Dolor de odos.  Dolor en la frente, detrs de los ojos y por encima de los pmulos (dolor sinusal).  Dolor en el pecho.  Tiene enfermedad pulmonar prolongada (crnica) y cualquiera de estos sntomas:  Sibilancias.  Tos prolongada.  Tos con sangre.  Cambio en la mucosidad habitual.  Presenta rigidez en el cuello.  Tiene cambios en:  La visin.  La audicin.  El pensamiento.  El [place] de nimo. ASEGRESE DE QUE:  Comprende estas instrucciones.  Controlar su afeccin.  Recibir ayuda de inmediato si no mejora o si empeora. Esta informacin no tiene Marine scientist el consejo del mdico. Asegrese de hacerle al mdico cualquier  pregunta que tenga. Document Released: 04/18/2011 Document Revised: 03/31/2015 Document Reviewed: 02/19/2014 Elsevier Interactive Patient Education  2017 [employer].     The entirety of the information documented in the History of Present Illness, Review of Systems and Physical Exam were personally obtained by me. Portions of this information were initially documented by Ashley Royalty, CMA and reviewed by me for thoroughness and accuracy.         Trinna Post, PA-C  [facility]

## 2016-11-09 ENCOUNTER — Telehealth: Payer: Self-pay | Admitting: Physician Assistant

## 2016-11-09 DIAGNOSIS — J3489 Other specified disorders of nose and nasal sinuses: Secondary | ICD-10-CM

## 2016-11-09 MED ORDER — MUPIROCIN 2 % EX OINT: 1.0000 "application " | TOPICAL_OINTMENT | Freq: Two times a day (BID) | CUTANEOUS | 0 refills | Status: DC

## 2016-11-09 NOTE — Telephone Encounter (Signed)
I have sent this ointment into pharmacy today. My very sincere apologies!

## 2016-11-09 NOTE — Telephone Encounter (Signed)
Pt advised.   Thanks,   -Neita Landrigan  

## 2016-11-09 NOTE — Telephone Encounter (Signed)
Pt's husband stated that he went to [facility] to pick up the medication Topical Mupirocin Ointment (bactroban) on Q-tip to rub on inside of nostril for dryness that pt was instructed to use at [place] 11/08/16, [facility] advised him he would need an Rx for this medication. Husband would like a call back to see if this is going to be sent in or if pt is supposed to use something similar to that medication OTC. Please advise. Thanks TNP

## 2016-11-15 ENCOUNTER — Ambulatory Visit (INDEPENDENT_AMBULATORY_CARE_PROVIDER_SITE_OTHER): Payer: 59 | Admitting: Physician Assistant

## 2016-11-15 ENCOUNTER — Encounter: Payer: Self-pay | Admitting: Physician Assistant

## 2016-11-15 VITALS — BP 100/60 | HR 110 | Temp 98.3°F | Resp 16 | Wt 126.2 lb

## 2016-11-15 DIAGNOSIS — H66003 Acute suppurative otitis media without spontaneous rupture of ear drum, bilateral: Secondary | ICD-10-CM

## 2016-11-15 DIAGNOSIS — J0120 Acute ethmoidal sinusitis, unspecified: Secondary | ICD-10-CM

## 2016-11-15 MED ORDER — PSEUDOEPHEDRINE HCL 30 MG PO TABS: 30.0000 mg | ORAL_TABLET | Freq: Four times a day (QID) | ORAL | 0 refills | Status: DC | PRN

## 2016-11-15 MED ORDER — AMOXICILLIN 875 MG PO TABS: 875.0000 mg | ORAL_TABLET | Freq: Two times a day (BID) | ORAL | 0 refills | Status: DC

## 2016-11-15 NOTE — Progress Notes (Signed)
Patient: Robin Griffin Female    DOB: 09-18-1991   25 y.o.   MRN: [id number] Visit Date: 11/15/2016  Today's Provider: Mar Daring, PA-C   Chief Complaint  Patient presents with  . URI   Subjective:    URI   This is a new problem. The current episode started 1 to 4 weeks ago (Symptoms started 10 days ago;Fever on Sunday). The problem has been gradually worsening. There has been no fever (Only Sunday of 102). Associated symptoms include congestion, coughing, ear pain (right ear pain), headaches (she reports it hurts behind her eyes and top of the head), a plugged ear sensation, rhinorrhea and sinus pain. Pertinent negatives include no abdominal pain, chest pain, vomiting or wheezing. She has tried acetaminophen (zyrtec) for the symptoms. The treatment provided no relief.      No Known Allergies   Current Outpatient Prescriptions:  .  mupirocin ointment (BACTROBAN) 2 %, Place 1 application into the nose 2 (two) times daily., Disp: 22 g, Rfl: 0  Review of Systems  Constitutional: Positive for fatigue and fever. Negative for chills.  HENT: Positive for congestion, ear pain (right ear pain), rhinorrhea, sinus pain and sinus pressure. Negative for postnasal drip.   Respiratory: Positive for cough. Negative for chest tightness, shortness of breath and wheezing.   Cardiovascular: Negative for chest pain, palpitations and leg swelling.  Gastrointestinal: Positive for abdominal distention (a lot of gas yesterday). Negative for abdominal pain and vomiting.  Neurological: Positive for headaches (she reports it hurts behind her eyes and top of the head). Negative for dizziness.    Social History  Substance Use Topics  . Smoking status: Never Smoker  . Smokeless tobacco: Never Used  . Alcohol use No   Objective:   BP 100/60 (BP Location: Right Arm, Patient Position: Sitting, Cuff Size: Normal)   Pulse (!) 110   Temp 98.3 F (36.8 C) (Oral)   Resp 16   Wt 126 lb 3.2  oz (57.2 kg)   LMP 10/11/2016   SpO2 99%   BMI 23.85 kg/m   Physical Exam  Constitutional: She appears well-developed and well-nourished. No distress.  HENT:  Head: Normocephalic and atraumatic.  Right Ear: Hearing, external ear and ear canal normal. Tympanic membrane is erythematous and bulging. Tympanic membrane is not perforated. A middle ear effusion (dull) is present.  Left Ear: Hearing, external ear and ear canal normal. Tympanic membrane is erythematous and bulging. Tympanic membrane is not perforated. A middle ear effusion (dull) is present.  Nose: Mucosal edema and rhinorrhea present. Right sinus exhibits no maxillary sinus tenderness and no frontal sinus tenderness. Left sinus exhibits no maxillary sinus tenderness and no frontal sinus tenderness.  Mouth/Throat: Uvula is midline and mucous membranes are normal. No oropharyngeal exudate, posterior oropharyngeal edema or posterior oropharyngeal erythema.  Ethmoid tenderness  Neck: Normal range of motion. Neck supple. No tracheal deviation present. No thyromegaly present.  Cardiovascular: Normal rate, regular rhythm and normal heart sounds.  Exam reveals no gallop and no friction rub.   No murmur heard. Pulmonary/Chest: Effort normal and breath sounds normal. No stridor. No respiratory distress. She has no wheezes. She has no rales.  Lymphadenopathy:    She has no cervical adenopathy.  Skin: She is not diaphoretic.  Vitals reviewed.      Assessment & Plan:     1. Acute ethmoidal sinusitis, recurrence not specified Worsening symptoms that have not responded to OTC medications. Will give amoxicillin as below.  Continue allergy medications. Will add sudafed as below for congestion. Stay well hydrated and get plenty of rest. Call if no symptom improvement or if symptoms worsen. - amoxicillin (AMOXIL) 875 MG tablet; Take 1 tablet (875 mg total) by mouth 2 (two) times daily.  Dispense: 20 tablet; Refill: 0 - pseudoephedrine (SUDAFED)  30 MG tablet; Take 1 tablet (30 mg total) by mouth every 6 (six) hours as needed for congestion.  Dispense: 30 tablet; Refill: 0  2. Acute suppurative otitis media of both ears without spontaneous rupture of tympanic membranes, recurrence not specified Worsening. Will treat with amoxicillin as below. She is to call if no improvement. - amoxicillin (AMOXIL) 875 MG tablet; Take 1 tablet (875 mg total) by mouth 2 (two) times daily.  Dispense: 20 tablet; Refill: 0       Mar Daring, PA-C  [facility]

## 2016-11-15 NOTE — Patient Instructions (Signed)
Sinusitis, Adult Sinusitis is soreness and inflammation of your sinuses. Sinuses are hollow spaces in the bones around your face. They are located:  Around your eyes.  In the middle of your forehead.  Behind your nose.  In your cheekbones. Your sinuses and nasal passages are lined with a stringy fluid (mucus). Mucus normally drains out of your sinuses. When your nasal tissues get inflamed or swollen, the mucus can get trapped or blocked so air cannot flow through your sinuses. This lets bacteria, viruses, and funguses grow, and that leads to infection. Follow these instructions at home: Medicines  Take, use, or apply over-the-counter and prescription medicines only as told by your doctor. These may include nasal sprays.  If you were prescribed an antibiotic medicine, take it as told by your doctor. Do not stop taking the antibiotic even if you start to feel better. Hydrate and Humidify  Drink enough water to keep your pee (urine) clear or pale yellow.  Use a cool mist humidifier to keep the humidity level in your home above 50%.  Breathe in steam for 10-15 minutes, 3-4 times a day or as told by your doctor. You can do this in the bathroom while a hot shower is running.  Try not to spend time in cool or dry air. Rest  Rest as much as possible.  Sleep with your head raised (elevated).  Make sure to get enough sleep each night. General instructions  Put a warm, moist washcloth on your face 3-4 times a day or as told by your doctor. This will help with discomfort.  Wash your hands often with soap and water. If there is no soap and water, use hand sanitizer.  Do not smoke. Avoid being around people who are smoking (secondhand smoke).  Keep all follow-up visits as told by your doctor. This is important. Contact a doctor if:  You have a fever.  Your symptoms get worse.  Your symptoms do not get better within 10 days. Get help right away if:  You have a very bad  headache.  You cannot stop throwing up (vomiting).  You have pain or swelling around your face or eyes.  You have trouble seeing.  You feel confused.  Your neck is stiff.  You have trouble breathing. This information is not intended to replace advice given to you by your health care provider. Make sure you discuss any questions you have with your health care provider. Document Released: 05/02/2008 Document Revised: 07/10/2016 Document Reviewed: 09/09/2015 Elsevier Interactive Patient Education  2017 [place]. Otitis Media, Adult Otitis media is redness, soreness, and puffiness (swelling) in the space just behind your eardrum (middle ear). It may be caused by allergies or infection. It often happens along with a cold. Follow these instructions at home:  Take your medicine as told. Finish it even if you start to feel better.  Only take over-the-counter or prescription medicines for pain, discomfort, or fever as told by your doctor.  Follow up with your doctor as told. Contact a doctor if:  You have otitis media only in one ear, or bleeding from your nose, or both.  You notice a lump on your neck.  You are not getting better in 3-5 days.  You feel worse instead of better. Get help right away if:  You have pain that is not helped with medicine.  You have puffiness, redness, or pain around your ear.  You get a stiff neck.  You cannot move part of your face (paralysis).  You notice that the bone behind your ear hurts when you touch it. This information is not intended to replace advice given to you by your health care provider. Make sure you discuss any questions you have with your health care provider. Document Released: 05/02/2008 Document Revised: 04/21/2016 Document Reviewed: 06/11/2013 Elsevier Interactive Patient Education  2017 [employer].

## 2016-11-24 ENCOUNTER — Telehealth: Payer: Self-pay

## 2016-11-24 LAB — CBC WITH DIFFERENTIAL/PLATELET
Basophils Absolute: 0.1 x10E3/uL (ref 0.0–0.2)
Basos: 1 %
EOS (ABSOLUTE): 0.1 x10E3/uL (ref 0.0–0.4)
Eos: 1 %
Hematocrit: 38.3 % (ref 34.0–46.6)
Hemoglobin: 12.3 g/dL (ref 11.1–15.9)
Immature Grans (Abs): 0 x10E3/uL (ref 0.0–0.1)
Immature Granulocytes: 1 %
Lymphocytes Absolute: 2.5 x10E3/uL (ref 0.7–3.1)
Lymphs: 38 %
MCH: 27.4 pg (ref 26.6–33.0)
MCHC: 32.1 g/dL (ref 31.5–35.7)
MCV: 85 fL (ref 79–97)
Monocytes Absolute: 0.5 x10E3/uL (ref 0.1–0.9)
Monocytes: 7 %
Neutrophils Absolute: 3.4 x10E3/uL (ref 1.4–7.0)
Neutrophils: 52 %
Platelets: 382 x10E3/uL — ABNORMAL HIGH (ref 150–379)
RBC: 4.49 x10E6/uL (ref 3.77–5.28)
RDW: 13.5 % (ref 12.3–15.4)
WBC: 6.6 x10E3/uL (ref 3.4–10.8)

## 2016-11-24 LAB — COMPREHENSIVE METABOLIC PANEL
A/G RATIO: 1.4 (ref 1.2–2.2)
ALT: 19 IU/L (ref 0–32)
AST: 13 IU/L (ref 0–40)
Albumin: 4.2 g/dL (ref 3.5–5.5)
Alkaline Phosphatase: 94 IU/L (ref 39–117)
BILIRUBIN TOTAL: 0.2 mg/dL (ref 0.0–1.2)
BUN/Creatinine Ratio: 15 (ref 9–23)
BUN: 8 mg/dL (ref 6–20)
CALCIUM: 9.2 mg/dL (ref 8.7–10.2)
CHLORIDE: 102 mmol/L (ref 96–106)
CO2: 25 mmol/L (ref 18–29)
Creatinine, Ser: 0.55 mg/dL — ABNORMAL LOW (ref 0.57–1.00)
GFR calc Af Amer: 149 mL/min/{1.73_m2} (ref >59)
GFR calc non Af Amer: 129 mL/min/{1.73_m2} (ref >59)
GLUCOSE: 87 mg/dL (ref 65–99)
Globulin, Total: 2.9 g/dL (ref 1.5–4.5)
POTASSIUM: 4.5 mmol/L (ref 3.5–5.2)
Sodium: 142 mmol/L (ref 134–144)
Total Protein: 7.1 g/dL (ref 6.0–8.5)

## 2016-11-24 LAB — HEMOGLOBIN A1C
ESTIMATED AVERAGE GLUCOSE: 100 mg/dL
Hgb A1c MFr Bld: 5.1 % (ref 4.8–5.6)

## 2016-11-24 LAB — LIPID PANEL
CHOL/HDL RATIO: 4.2 ratio (ref 0.0–4.4)
Cholesterol, Total: 192 mg/dL (ref 100–199)
HDL: 46 mg/dL (ref >39)
LDL Calculated: 134 mg/dL — ABNORMAL HIGH (ref 0–99)
TRIGLYCERIDES: 59 mg/dL (ref 0–149)
VLDL CHOLESTEROL CAL: 12 mg/dL (ref 5–40)

## 2016-11-24 LAB — TSH: TSH: 1.77 u[IU]/mL (ref 0.450–4.500)

## 2016-11-24 NOTE — Telephone Encounter (Signed)
Patient advised as below. Patient verbalizes understanding and is in agreement with treatment plan.  

## 2016-11-24 NOTE — Telephone Encounter (Signed)
-----   Message from Mar Daring, [place] sent at 11/24/2016 10:03 AM EST ----- All labs are within normal limits and stable.  Thanks! -JB

## 2017-01-19 ENCOUNTER — Ambulatory Visit (INDEPENDENT_AMBULATORY_CARE_PROVIDER_SITE_OTHER): Payer: 59 | Admitting: Physician Assistant

## 2017-01-19 ENCOUNTER — Encounter: Payer: Self-pay | Admitting: Physician Assistant

## 2017-01-19 VITALS — BP 124/76 | HR 92 | Temp 98.3°F | Resp 16 | Wt 120.0 lb

## 2017-01-19 DIAGNOSIS — R51 Headache: Secondary | ICD-10-CM

## 2017-01-19 DIAGNOSIS — R519 Headache, unspecified: Secondary | ICD-10-CM

## 2017-01-19 NOTE — Progress Notes (Signed)
Patient: Robin Griffin Female    DOB: [birth year]   26 y.o.   MRN: [id number] Visit Date: 01/19/2017  Today's Provider: Trinna Post, PA-C   Chief Complaint  Patient presents with  . Headache    Started yesterday   Subjective:    Headache   This is a new problem. The current episode started yesterday. The problem occurs constantly. The problem has been waxing and waning. The pain is located in the frontal region. The pain quality is similar to prior headaches (Pt reports having a history of migraines but this "Feels worse".). Quality: Pt describes it as "Heavy Pain" Associated symptoms include photophobia. Pertinent negatives include no dizziness, ear pain, eye pain, eye redness, fever, loss of balance, nausea, numbness, rhinorrhea, seizures, sinus pressure, sore throat, tinnitus or weakness. She has tried nothing for the symptoms. Her past medical history is significant for migraine headaches.   Patient is a 26 y/o woman with history of migraines presenting today with headache. Above HPI. Says it does feel better now than this morning. Doesn't like to take medications and wanted to get checked out first before taking anything. Patient works for [employer] and sits in front of a computer all day. No night time awakenings.    No Known Allergies  No current outpatient prescriptions on file.  Review of Systems  Constitutional: Negative for activity change, appetite change, chills, diaphoresis, fatigue, fever and unexpected weight change.  HENT: Negative for congestion, ear discharge, ear pain, nosebleeds, postnasal drip, rhinorrhea, sinus pain, sinus pressure, sneezing, sore throat, tinnitus, trouble swallowing and voice change.   Eyes: Positive for photophobia. Negative for pain, discharge, redness, itching and visual disturbance.  Gastrointestinal: Negative.  Negative for nausea.  Neurological: Positive for headaches. Negative for dizziness, tremors, seizures, syncope, facial  asymmetry, speech difficulty, weakness, light-headedness, numbness and loss of balance.    Social History  Substance Use Topics  . Smoking status: Never Smoker  . Smokeless tobacco: Never Used  . Alcohol use No   Objective:   BP 124/76 (BP Location: Left Arm, Patient Position: Sitting, Cuff Size: Normal)   Pulse 92   Temp 98.3 F (36.8 C) (Oral)   Resp 16   Wt 120 lb (54.4 kg)   LMP 01/05/2017   BMI 22.67 kg/m   Physical Exam  Constitutional: She is oriented to person, place, and time. She appears well-developed and well-nourished. No distress.  HENT:  Head: Normocephalic and atraumatic.  Right Ear: Tympanic membrane and external ear normal.  Left Ear: Tympanic membrane and external ear normal.  Mouth/Throat: Oropharynx is clear and moist. No oropharyngeal exudate.  Eyes: Conjunctivae and EOM are normal. Pupils are equal, round, and reactive to light.  Neck: Neck supple.  Lymphadenopathy:    She has no cervical adenopathy.  Neurological: She is alert and oriented to person, place, and time. She has normal reflexes. No cranial nerve deficit or sensory deficit. Coordination and gait normal. GCS eye subscore is 4. GCS verbal subscore is 5. GCS motor subscore is 6.  Skin: Skin is warm and dry. She is not diaphoretic.  Psychiatric: She has a normal mood and affect. Her behavior is normal.        Assessment & Plan:     1. Mixed headache  Headache has both tension and migrainous features. Counseled on generally benign nature of primary headaches and her lack of red flag symptoms. Counseled on use of OTC medications. If persistence, can refer to neurology.  Return if symptoms worsen or fail to improve.  The entirety of the information documented in the History of Present Illness, Review of Systems and Physical Exam were personally obtained by me. Portions of this information were initially documented by Ashley Royalty, CMA and reviewed by me for thoroughness and accuracy.            Trinna Post, PA-C  [facility]

## 2017-01-19 NOTE — Patient Instructions (Signed)
Can Take Tylenol as first line or Aleve  If these don't work, may take Excedrin   If headaches become more frequent, we can refer to neurology.   General Headache Without Cause Introduction A headache is pain or discomfort felt around the head or neck area. There are many causes and types of headaches. In some cases, the cause may not be found. Follow these instructions at home: Managing pain  Take over-the-counter and prescription medicines only as told by your doctor.  Lie down in a dark, quiet room when you have a headache.  If directed, apply ice to the head and neck area:  Put ice in a plastic bag.  Place a towel between your skin and the bag.  Leave the ice on for 20 minutes, 2-3 times per day.  Use a heating pad or hot shower to apply heat to the head and neck area as told by your doctor.  Keep lights dim if bright lights bother you or make your headaches worse. Eating and drinking  Eat meals on a regular schedule.  Lessen how much alcohol you drink.  Lessen how much caffeine you drink, or stop drinking caffeine. General instructions  Keep all follow-up visits as told by your doctor. This is important.  Keep a journal to find out if certain things bring on headaches. For example, write down:  What you eat and drink.  How much sleep you get.  Any change to your diet or medicines.  Relax by getting a massage or doing other relaxing activities.  Lessen stress.  Sit up straight. Do not tighten (tense) your muscles.  Do not use tobacco products. This includes cigarettes, chewing tobacco, or e-cigarettes. If you need help quitting, ask your doctor.  Exercise regularly as told by your doctor.  Get enough sleep. This often means 7-9 hours of sleep. Contact a doctor if:  Your symptoms are not helped by medicine.  You have a headache that feels different than the other headaches.  You feel sick to your stomach (nauseous) or you throw up (vomit).  You  have a fever. Get help right away if:  Your headache becomes really bad.  You keep throwing up.  You have a stiff neck.  You have trouble seeing.  You have trouble speaking.  You have pain in the eye or ear.  Your muscles are weak or you lose muscle control.  You lose your balance or have trouble walking.  You feel like you will pass out (faint) or you pass out.  You have confusion. This information is not intended to replace advice given to you by your health care provider. Make sure you discuss any questions you have with your health care provider. Document Released: 08/23/2008 Document Revised: 04/21/2016 Document Reviewed: 03/09/2015  2017 Elsevier

## 2017-11-06 ENCOUNTER — Encounter: Payer: 59 | Admitting: Physician Assistant

## 2017-11-07 ENCOUNTER — Ambulatory Visit (INDEPENDENT_AMBULATORY_CARE_PROVIDER_SITE_OTHER): Payer: 59 | Admitting: Physician Assistant

## 2017-11-07 ENCOUNTER — Encounter: Payer: Self-pay | Admitting: Physician Assistant

## 2017-11-07 VITALS — BP 90/66 | HR 100 | Temp 98.2°F | Resp 16 | Ht 61.0 in | Wt 117.0 lb

## 2017-11-07 DIAGNOSIS — N6001 Solitary cyst of right breast: Secondary | ICD-10-CM

## 2017-11-07 DIAGNOSIS — Z0000 Encounter for general adult medical examination without abnormal findings: Secondary | ICD-10-CM

## 2017-11-07 NOTE — Patient Instructions (Signed)

## 2017-11-07 NOTE — Progress Notes (Signed)
Patient: Robin Griffin, Female    DOB: Mar 31, 1991, 26 y.o.   MRN: [id number] Visit Date: 11/07/2017  Today's Provider: Mar Daring, PA-C   Chief Complaint  Patient presents with  . Annual Exam   Subjective:    Annual physical exam Robin Griffin is a 26 y.o. female who presents today for health maintenance and complete physical. She feels well. She reports exercising 2-3 days. She reports she is sleeping well.  11/03/16 CPE 11/03/15 Pap-neg ----------------------------------------------------------------- Patient reports she had pain around right bra line last week. Patient reports it was painful and she did feel a "lump" but is not there any more.   Review of Systems  Constitutional: Negative.   HENT: Negative.   Eyes: Negative.   Respiratory: Negative.   Cardiovascular: Negative.   Gastrointestinal: Negative.   Endocrine: Negative.   Genitourinary: Negative.   Musculoskeletal: Negative.   Skin: Negative.   Allergic/Immunologic: Negative.   Neurological: Negative.   Hematological: Negative.   Psychiatric/Behavioral: Negative.     Social History      She  reports that  has never smoked. she has never used smokeless tobacco. She reports that she does not drink alcohol or use drugs.       Social History   Socioeconomic History  . Marital status: Married    Spouse name: None  . Number of children: None  . Years of education: None  . Highest education level: None  Social Needs  . Financial resource strain: None  . Food insecurity - worry: None  . Food insecurity - inability: None  . Transportation needs - medical: None  . Transportation needs - non-medical: None  Occupational History  . None  Tobacco Use  . Smoking status: Never Smoker  . Smokeless tobacco: Never Used  Substance and Sexual Activity  . Alcohol use: No  . Drug use: No  . Sexual activity: Yes  Other Topics Concern  . None  Social History Narrative  . None    Past  Medical History:  Diagnosis Date  . Dermoid cyst of left ovary 08/16/2016  . Headache    migraines stress related while in college  . Seizures ([place])    childhood with fever     Patient Active Problem List   Diagnosis Date Noted  . Dermoid cyst of left ovary 08/16/2016  . SVD (spontaneous vaginal delivery) 04/14/2015  . Postpartum care following vaginal delivery (5/16) 04/14/2015  . Active labor at term 04/13/2015    Past Surgical History:  Procedure Laterality Date  . LAPAROSCOPIC OVARIAN CYSTECTOMY Left 08/16/2016   Procedure: LAPAROSCOPIC OVARIAN CYSTECTOMY;  Surgeon: Azucena Fallen, MD;  Location: [facility];  Service: Gynecology;  Laterality: Left;    Family History        Family Status  Relation Name Status  . Mother  Alive  . Father  Alive  . Sister  Alive  . Brother  Alive        Her family history is not on file.     No Known Allergies  No current outpatient medications on file.   Patient Care Team: Mar Daring, PA-C as PCP - General (Family Medicine)      Objective:   Vitals: BP 90/66 (BP Location: Left Arm, Patient Position: Sitting, Cuff Size: Normal)   Pulse 100   Temp 98.2 F (36.8 C) (Oral)   Resp 16   Ht 5\' 1"  (1.549 m)   Wt 117 lb (53.1 kg)  SpO2 99%   BMI 22.11 kg/m    Vitals:   11/07/17 1330  BP: 90/66  Pulse: 100  Resp: 16  Temp: 98.2 F (36.8 C)  TempSrc: Oral  SpO2: 99%  Weight: 117 lb (53.1 kg)  Height: 5\' 1"  (1.549 m)     Physical Exam  Constitutional: She is oriented to person, place, and time. She appears well-developed and well-nourished. No distress.  HENT:  Head: Normocephalic and atraumatic.  Right Ear: Hearing, tympanic membrane, external ear and ear canal normal.  Left Ear: Hearing, tympanic membrane, external ear and ear canal normal.  Nose: Nose normal.  Mouth/Throat: Uvula is midline, oropharynx is clear and moist and mucous membranes are normal. No oropharyngeal exudate.  Eyes: Conjunctivae and EOM are  normal. Pupils are equal, round, and reactive to light. Right eye exhibits no discharge. Left eye exhibits no discharge. No scleral icterus.  Neck: Normal range of motion. Neck supple. No JVD present. No tracheal deviation present. No thyromegaly present.  Cardiovascular: Normal rate, regular rhythm, normal heart sounds and intact distal pulses. Exam reveals no gallop and no friction rub.  No murmur heard. Pulmonary/Chest: Effort normal and breath sounds normal. No respiratory distress. She has no wheezes. She has no rales. She exhibits no tenderness. Right breast exhibits mass. Right breast exhibits no inverted nipple, no nipple discharge, no skin change and no tenderness. Left breast exhibits no inverted nipple, no mass, no nipple discharge, no skin change and no tenderness. Breasts are symmetrical.    Abdominal: Soft. Bowel sounds are normal. She exhibits no distension and no mass. There is no tenderness. There is no rebound and no guarding.  Musculoskeletal: Normal range of motion. She exhibits no edema or tenderness.  Lymphadenopathy:    She has no cervical adenopathy.  Neurological: She is alert and oriented to person, place, and time.  Skin: Skin is warm and dry. No rash noted. She is not diaphoretic.  Psychiatric: She has a normal mood and affect. Her behavior is normal. Judgment and thought content normal.  Vitals reviewed.    Depression Screen PHQ 2/9 Scores 11/07/2017  PHQ - 2 Score 0  PHQ- 9 Score 0      Assessment & Plan:     Routine Health Maintenance and Physical Exam  Exercise Activities and Dietary recommendations Goals    None      Immunization History  Administered Date(s) Administered  . Influenza,inj,Quad PF,6+ Mos 11/03/2016  . Influenza-Unspecified 10/06/2015, 09/20/2017    Health Maintenance  Topic Date Due  . TETANUS/TDAP  05/07/2008  . PAP SMEAR  11/02/2018  . INFLUENZA VACCINE  Completed  . HIV Screening  Completed     Discussed health  benefits of physical activity, and encouraged her to engage in regular exercise appropriate for her age and condition.    1. Annual physical exam Normal physical exam today. Will check labs as below and f/u pending lab results. If labs are stable and WNL she will not need to have these rechecked for one year at her next annual physical exam. She is to call the office in the meantime if she has any acute issue, questions or concerns. - CBC with Differential/Platelet - Comprehensive metabolic panel - Lipid panel - TSH - HgB A1c  2. Cyst (solitary) of breast, right No longer present. Suspect it was a benign cyst that became irritated from her bra. Advised to monitor and call if it returns.   --------------------------------------------------------------------    Mar Daring, PA-C  [facility]

## 2017-11-08 LAB — CBC WITH DIFFERENTIAL/PLATELET
BASOS ABS: 0 10*3/uL (ref 0.0–0.2)
Basos: 0 %
EOS (ABSOLUTE): 0.1 10*3/uL (ref 0.0–0.4)
EOS: 1 %
HEMATOCRIT: 37.6 % (ref 34.0–46.6)
HEMOGLOBIN: 12.9 g/dL (ref 11.1–15.9)
IMMATURE GRANULOCYTES: 0 %
Immature Grans (Abs): 0 10*3/uL (ref 0.0–0.1)
Lymphocytes Absolute: 2.6 10*3/uL (ref 0.7–3.1)
Lymphs: 30 %
MCH: 29.4 pg (ref 26.6–33.0)
MCHC: 34.3 g/dL (ref 31.5–35.7)
MCV: 86 fL (ref 79–97)
MONOCYTES: 5 %
MONOS ABS: 0.4 10*3/uL (ref 0.1–0.9)
NEUTROS PCT: 64 %
Neutrophils Absolute: 5.7 10*3/uL (ref 1.4–7.0)
Platelets: 313 10*3/uL (ref 150–379)
RBC: 4.39 x10E6/uL (ref 3.77–5.28)
RDW: 13.8 % (ref 12.3–15.4)
WBC: 8.8 10*3/uL (ref 3.4–10.8)

## 2017-11-08 LAB — LIPID PANEL
CHOLESTEROL TOTAL: 160 mg/dL (ref 100–199)
Chol/HDL Ratio: 3.2 ratio (ref 0.0–4.4)
HDL: 50 mg/dL (ref >39)
LDL CALC: 100 mg/dL — AB (ref 0–99)
Triglycerides: 49 mg/dL (ref 0–149)
VLDL Cholesterol Cal: 10 mg/dL (ref 5–40)

## 2017-11-08 LAB — COMPREHENSIVE METABOLIC PANEL
ALT: 12 IU/L (ref 0–32)
AST: 18 IU/L (ref 0–40)
Albumin/Globulin Ratio: 2 (ref 1.2–2.2)
Albumin: 4.5 g/dL (ref 3.5–5.5)
Alkaline Phosphatase: 67 IU/L (ref 39–117)
BUN/Creatinine Ratio: 13 (ref 9–23)
BUN: 8 mg/dL (ref 6–20)
Bilirubin Total: 0.3 mg/dL (ref 0.0–1.2)
CALCIUM: 8.9 mg/dL (ref 8.7–10.2)
CO2: 25 mmol/L (ref 20–29)
CREATININE: 0.63 mg/dL (ref 0.57–1.00)
Chloride: 105 mmol/L (ref 96–106)
GFR calc Af Amer: 141 mL/min/{1.73_m2} (ref >59)
GFR, EST NON AFRICAN AMERICAN: 122 mL/min/{1.73_m2} (ref >59)
GLOBULIN, TOTAL: 2.3 g/dL (ref 1.5–4.5)
GLUCOSE: 80 mg/dL (ref 65–99)
Potassium: 4 mmol/L (ref 3.5–5.2)
SODIUM: 141 mmol/L (ref 134–144)
Total Protein: 6.8 g/dL (ref 6.0–8.5)

## 2017-11-08 LAB — TSH: TSH: 0.906 u[IU]/mL (ref 0.450–4.500)

## 2017-11-09 ENCOUNTER — Telehealth: Payer: Self-pay

## 2017-11-09 NOTE — Telephone Encounter (Signed)
-----   Message from Mar Daring, [place] sent at 11/09/2017  8:07 AM EST ----- All labs are within normal limits and stable.  Thanks! -JB

## 2017-11-09 NOTE — Telephone Encounter (Signed)
Patient traveling with husband to [place]. Once they come back they will call for results. Patient and husband seen on 12/11 And stated they were leaving that afternoon  For vacation to [place].  Thanks,  -Romeka Scifres

## 2018-01-10 ENCOUNTER — Ambulatory Visit: Payer: Managed Care, Other (non HMO) | Admitting: Physician Assistant

## 2018-01-10 ENCOUNTER — Ambulatory Visit
Admission: RE | Admit: 2018-01-10 | Discharge: 2018-01-10 | Disposition: A | Discharge status: Home / Self Care | Payer: Managed Care, Other (non HMO) | Source: Ambulatory Visit | Attending: Physician Assistant | Admitting: Physician Assistant

## 2018-01-10 ENCOUNTER — Telehealth: Payer: Self-pay

## 2018-01-10 ENCOUNTER — Encounter: Payer: Self-pay | Admitting: Physician Assistant

## 2018-01-10 VITALS — BP 110/78 | HR 128 | Temp 98.7°F | Resp 16 | Wt 115.6 lb

## 2018-01-10 DIAGNOSIS — S29011A Strain of muscle and tendon of front wall of thorax, initial encounter: Secondary | ICD-10-CM | POA: Diagnosis not present

## 2018-01-10 DIAGNOSIS — W108XXA Fall (on) (from) other stairs and steps, initial encounter: Secondary | ICD-10-CM | POA: Diagnosis not present

## 2018-01-10 DIAGNOSIS — H66001 Acute suppurative otitis media without spontaneous rupture of ear drum, right ear: Secondary | ICD-10-CM | POA: Diagnosis not present

## 2018-01-10 DIAGNOSIS — R0781 Pleurodynia: Secondary | ICD-10-CM | POA: Diagnosis not present

## 2018-01-10 MED ORDER — OFLOXACIN 0.3 % OT SOLN: 5.0000 [drp] | Freq: Every day | OTIC | 0 refills | Status: DC

## 2018-01-10 NOTE — Progress Notes (Signed)
Patient: Robin Griffin Female    DOB: 1991/05/16   27 y.o.   MRN: [id number] Visit Date: 01/10/2018  Today's Provider: Mar Daring, PA-C   Chief Complaint  Patient presents with  . Chest Pain   Subjective:    HPI Patient here today C/O pain on right side of chest and radiates to her back x's 3 days. Patient reports she did fall last week at her house. Patient reports that she is also having pain on her right underarm. Patient denies any bruises or rash. Patient reports she has not taken any thing for pain. Patient reports pain is mild. Patient reports she is concerned about pain, reports that one of her uncles died recently of lung cancer.   Patient is also having right ear pain x 2 days. No fevers. Mild URI symptoms associated with runny nose and headache but ear pain is main complaint.     No Known Allergies  No current outpatient medications on file.  Review of Systems  Constitutional: Negative.   HENT: Positive for sneezing.   Respiratory: Negative.   Cardiovascular: Negative.   Musculoskeletal: Positive for back pain and myalgias.  Psychiatric/Behavioral: The patient is nervous/anxious.     Social History   Tobacco Use  . Smoking status: Never Smoker  . Smokeless tobacco: Never Used  Substance Use Topics  . Alcohol use: No   Objective:   BP 110/78 (BP Location: Left Arm, Patient Position: Sitting, Cuff Size: Normal)   Pulse (!) 128   Temp 98.7 F (37.1 C) (Oral)   Resp 16   Wt 115 lb 9.6 oz (52.4 kg)   SpO2 98%   BMI 21.84 kg/m  Vitals:   01/10/18 1357  BP: 110/78  Pulse: (!) 128  Resp: 16  Temp: 98.7 F (37.1 C)  TempSrc: Oral  SpO2: 98%  Weight: 115 lb 9.6 oz (52.4 kg)     Physical Exam  Constitutional: She appears well-developed and well-nourished. No distress.  Neck: Normal range of motion. Neck supple. No JVD present. No tracheal deviation present. No thyromegaly present.  Cardiovascular: Normal rate, regular rhythm, normal  heart sounds and intact distal pulses. Exam reveals no gallop and no friction rub.  No murmur heard. Pulmonary/Chest: Effort normal and breath sounds normal. No respiratory distress. She has no wheezes. She has no rales. She exhibits tenderness.    Lymphadenopathy:    She has no cervical adenopathy.  Skin: She is not diaphoretic.  Vitals reviewed.       Assessment & Plan:     1. Acute suppurative otitis media of right ear without spontaneous rupture of tympanic membrane, recurrence not specified Will treat with Floxin ear drops as below. Tylenol or IBU prn. Call if no improvements.  - ofloxacin (FLOXIN) 0.3 % OTIC solution; Place 5 drops into the right ear daily. X 5-7 days  Dispense: 5 mL; Refill: 0  2. Fall (on) (from) other stairs and steps, initial encounter [place] last week while walking down stairs straight on her back. She tried to catch herself by reaching back with her hands on the railing of the stairs, so her hands were up stretched behind her. I do feel she has a pectoralis muscle strain and possibly intercostal strain. Patient is having no SOB and no deformity felt. I will get imaging as below to make sure no fracture or other reason for pain as patient is worried about cancer stating "I have many family members that have died  from cancer that was found at the late stages and if there is something I can do I would rather know so I can treat it." I will f/u pending results of the imaging. Otherwise treat conservatively with heating pad and IBU prn.  - DG Ribs Unilateral Right; Future  3. Pectoralis muscle strain, initial encounter See above medical treatment plan.       Mar Daring, PA-C  [facility]

## 2018-01-10 NOTE — Telephone Encounter (Signed)
Patient is calling stating she is having right side "rib" pain. In the last three she has been having this pain. Reports the pain is off and on every 10 minutes, not constantly. Denies no chest pian, no SOB, No injury. She reports that she fell 4 steps last week. Patient schedule to come see Jenni at 1:45.  Thanks,  -Allard Lightsey

## 2018-01-11 ENCOUNTER — Telehealth: Payer: Self-pay | Admitting: Physician Assistant

## 2018-01-11 NOTE — Telephone Encounter (Signed)
-----   Message from Mar Daring, [place] sent at 01/10/2018  5:21 PM EST ----- Xray is normal, no fractures or masses noted

## 2018-01-11 NOTE — Telephone Encounter (Signed)
LMTCB  Thanks,  -Bryla Burek 

## 2018-01-11 NOTE — Telephone Encounter (Signed)
Pt is requesting a call back to get her Xray results for the Xray done on 01/10/18. Please advise. Thanks TNP

## 2018-01-12 ENCOUNTER — Encounter: Payer: Self-pay | Admitting: Physician Assistant

## 2018-01-12 ENCOUNTER — Ambulatory Visit: Payer: Managed Care, Other (non HMO) | Admitting: Physician Assistant

## 2018-01-12 VITALS — BP 110/62 | HR 66 | Temp 97.8°F | Resp 16 | Wt 115.0 lb

## 2018-01-12 DIAGNOSIS — N6320 Unspecified lump in the left breast, unspecified quadrant: Secondary | ICD-10-CM | POA: Diagnosis not present

## 2018-01-12 DIAGNOSIS — Z129 Encounter for screening for malignant neoplasm, site unspecified: Secondary | ICD-10-CM

## 2018-01-12 NOTE — Telephone Encounter (Signed)
Pt advised. She reports she is having breast pain and would like to come in to have a breast exam.  Made apt for 4pm today.   Thanks,   -Mickel Baas

## 2018-01-12 NOTE — Progress Notes (Signed)
       Patient: Robin Griffin Female    DOB: [birth year]   26 y.o.   MRN: [id number] Visit Date: 01/12/2018  Today's Provider: Mar Daring, PA-C   Chief Complaint  Patient presents with  . Breast Pain   Subjective:    HPI Pt is here today for breast/underarm pain. She fell at her home last week. She was seen on 01/10/18 for this and reported pain in her right underarm and breast area. Today the pain she is reporting is on her left underarm and breast area. No rash or briusing. She wants her breast checked for any lumps as well. LMP 12/11/17. Pt reports that she has Hormone IUD and does not think she is pregnant.        No Known Allergies   Current Outpatient Medications:  .  ofloxacin (FLOXIN) 0.3 % OTIC solution, Place 5 drops into the right ear daily. X 5-7 days, Disp: 5 mL, Rfl: 0  Review of Systems  Constitutional: Negative.   HENT: Negative.   Eyes: Negative.   Respiratory: Negative.   Cardiovascular: Negative.   Gastrointestinal: Negative.   Endocrine: Negative.   Genitourinary: Negative.   Musculoskeletal: Negative.   Skin: Negative.   Allergic/Immunologic: Negative.   Neurological: Negative.   Hematological: Negative.   Psychiatric/Behavioral: Negative.     Social History   Tobacco Use  . Smoking status: Never Smoker  . Smokeless tobacco: Never Used  Substance Use Topics  . Alcohol use: No   Objective:   BP 110/62 (BP Location: Left Arm, Patient Position: Sitting, Cuff Size: Normal)   Pulse 66   Temp 97.8 F (36.6 C) (Oral)   Resp 16   Wt 115 lb (52.2 kg)   LMP 12/11/2017 (Exact Date)   BMI 21.73 kg/m  Vitals:   01/12/18 1604  BP: 110/62  Pulse: 66  Resp: 16  Temp: 97.8 F (36.6 C)  TempSrc: Oral  Weight: 115 lb (52.2 kg)     Physical Exam  Pulmonary/Chest: Right breast exhibits tenderness. Right breast exhibits no inverted nipple, no mass, no nipple discharge and no skin change. Left breast exhibits mass and tenderness. Left  breast exhibits no inverted nipple, no nipple discharge and no skin change. Breasts are symmetrical.    left breast mass noted between 12-1 oc clock approx 3 cm from nipple line, tender to palpation, well circumscribed and mobile       Assessment & Plan:     1. Cancer screening Patient is concerned over cancers in her family. She reports it is all she can think about. Most recently had an uncle pass from lung cancer. Reports having multiple cancers in her family that were all diagnosed late and they passed from them. She is requesting blood work for "any cancer" as her husband works for [employer] and testing is free. Labs ordered as below.  - CBC w/Diff/Platelet - AFP tumor marker - CEA - CA 125 - Cancer antigen 27.29 - Cancer antigen 19-9  2. Left breast mass Will get imaging as below. Suspect small cyst or duct but for patient peace of mind imaging is ordered as below. I will f/u pending results.  - MM Digital Diagnostic Unilat L; Future - US BREAST LTD UNI LEFT INC AXILLA; Future       Mar Daring, PA-C  [facility]

## 2018-01-13 LAB — CBC WITH DIFFERENTIAL/PLATELET
BASOS ABS: 0 10*3/uL (ref 0.0–0.2)
Basos: 0 %
EOS (ABSOLUTE): 0.3 10*3/uL (ref 0.0–0.4)
Eos: 3 %
HEMATOCRIT: 39.8 % (ref 34.0–46.6)
HEMOGLOBIN: 12.9 g/dL (ref 11.1–15.9)
Immature Grans (Abs): 0 10*3/uL (ref 0.0–0.1)
Immature Granulocytes: 0 %
LYMPHS ABS: 3.4 10*3/uL — AB (ref 0.7–3.1)
Lymphs: 38 %
MCH: 27.9 pg (ref 26.6–33.0)
MCHC: 32.4 g/dL (ref 31.5–35.7)
MCV: 86 fL (ref 79–97)
Monocytes Absolute: 0.7 10*3/uL (ref 0.1–0.9)
Monocytes: 8 %
NEUTROS ABS: 4.7 10*3/uL (ref 1.4–7.0)
Neutrophils: 51 %
Platelets: 290 10*3/uL (ref 150–379)
RBC: 4.63 x10E6/uL (ref 3.77–5.28)
RDW: 12.9 % (ref 12.3–15.4)
WBC: 9 10*3/uL (ref 3.4–10.8)

## 2018-01-13 LAB — CA 125: Cancer Antigen (CA) 125: 18.7 U/mL (ref 0.0–38.1)

## 2018-01-13 LAB — CANCER ANTIGEN 19-9: CA 19-9: 17 U/mL (ref 0–35)

## 2018-01-13 LAB — CANCER ANTIGEN 27.29: CAN 27.29: 15.9 U/mL (ref 0.0–38.6)

## 2018-01-13 LAB — AFP TUMOR MARKER: AFP, Serum, Tumor Marker: 3.8 ng/mL (ref 0.0–8.3)

## 2018-01-13 LAB — CEA: CEA: 0.9 ng/mL (ref 0.0–4.7)

## 2018-01-15 ENCOUNTER — Telehealth: Payer: Self-pay

## 2018-01-15 NOTE — Telephone Encounter (Signed)
-----   Message from Mar Daring, [place] sent at 01/15/2018  3:45 PM EST ----- All tumor markers are negative and other labs are WNL.

## 2018-01-15 NOTE — Telephone Encounter (Signed)
Patient advised as directed below. Patient is asking if she needs to come in for you to order the [place]. I told patient I would check with you first.  Thanks,  -Joseline

## 2018-01-16 NOTE — Telephone Encounter (Signed)
No diagnostic mammogram and [place] have both been ordered. She will receive a call from [facility] to schedule this.

## 2018-01-23 ENCOUNTER — Encounter: Payer: Self-pay | Admitting: Physician Assistant

## 2018-01-23 ENCOUNTER — Ambulatory Visit
Admission: RE | Admit: 2018-01-23 | Discharge: 2018-01-23 | Disposition: A | Discharge status: Home / Self Care | Payer: Managed Care, Other (non HMO) | Source: Ambulatory Visit | Attending: Physician Assistant | Admitting: Physician Assistant

## 2018-01-23 ENCOUNTER — Ambulatory Visit: Payer: Managed Care, Other (non HMO) | Admitting: Physician Assistant

## 2018-01-23 ENCOUNTER — Telehealth: Payer: Self-pay

## 2018-01-23 VITALS — BP 90/70 | HR 99 | Temp 98.2°F | Resp 16 | Wt 114.0 lb

## 2018-01-23 DIAGNOSIS — N6320 Unspecified lump in the left breast, unspecified quadrant: Secondary | ICD-10-CM | POA: Insufficient documentation

## 2018-01-23 DIAGNOSIS — S161XXA Strain of muscle, fascia and tendon at neck level, initial encounter: Secondary | ICD-10-CM

## 2018-01-23 NOTE — Progress Notes (Signed)
       Patient: Robin Griffin Female    DOB: August 09, 1991   27 y.o.   MRN: [id number] Visit Date: 01/23/2018  Today's Provider: Mar Daring, PA-C   Chief Complaint  Patient presents with  . Ear Pain   Subjective:    HPI Patient here today C/O persistent right ear pain. Patient reports using floxin drops. Patient denies fever or discharge, pt reports runny nose in the mornings. Patient denies sore throat or cough. Patient reports pain on right side of neck, patient reports sensation of "swelling or pulling on the right side of neck."     No Known Allergies   Current Outpatient Medications:  .  ofloxacin (FLOXIN) 0.3 % OTIC solution, Place 5 drops into the right ear daily. X 5-7 days, Disp: 5 mL, Rfl: 0  Review of Systems  Constitutional: Negative.   HENT: Positive for ear pain. Negative for congestion, ear discharge, hearing loss, postnasal drip, rhinorrhea, sinus pressure, sinus pain, sneezing, sore throat and tinnitus.   Respiratory: Negative.   Cardiovascular: Negative.   Gastrointestinal: Negative.   Musculoskeletal: Positive for myalgias and neck pain.  Neurological: Negative.     Social History   Tobacco Use  . Smoking status: Never Smoker  . Smokeless tobacco: Never Used  Substance Use Topics  . Alcohol use: No   Objective:   BP 90/70 (BP Location: Left Arm, Patient Position: Sitting, Cuff Size: Normal)   Pulse 99   Temp 98.2 F (36.8 C) (Oral)   Resp 16   Wt 114 lb (51.7 kg)   SpO2 99%   BMI 21.54 kg/m  Vitals:   01/23/18 1130  BP: 90/70  Pulse: 99  Resp: 16  Temp: 98.2 F (36.8 C)  TempSrc: Oral  SpO2: 99%  Weight: 114 lb (51.7 kg)     Physical Exam  Constitutional: She appears well-developed and well-nourished. No distress.  HENT:  Head: Normocephalic and atraumatic.  Right Ear: Hearing, tympanic membrane, external ear and ear canal normal.  Left Ear: Hearing, tympanic membrane, external ear and ear canal normal.  Nose: Nose  normal.  Mouth/Throat: Uvula is midline, oropharynx is clear and moist and mucous membranes are normal. No oropharyngeal exudate.  Eyes: Conjunctivae are normal. Pupils are equal, round, and reactive to light. Right eye exhibits no discharge. Left eye exhibits no discharge. No scleral icterus.  Neck: Normal range of motion and full passive range of motion without pain. Neck supple. Muscular tenderness (SCM) present. No spinous process tenderness present. No tracheal deviation and normal range of motion present. No thyromegaly present.  Patient is tender along right SCM muscle with most tenderness noted over the right mastoid process (origin of muscle).  Cardiovascular: Normal rate, regular rhythm and normal heart sounds. Exam reveals no gallop and no friction rub.  No murmur heard. Pulmonary/Chest: Effort normal and breath sounds normal. No stridor. No respiratory distress. She has no wheezes. She has no rales.  Lymphadenopathy:    She has no cervical adenopathy.  Skin: Skin is warm and dry. She is not diaphoretic.  Vitals reviewed.       Assessment & Plan:     1. Strain of sternocleidomastoid muscle, initial encounter Advised patient of SCM stretches, may use heating pad, massage therapy, epsom salt soaks. Call if pain persists.       Mar Daring, PA-C  [facility]

## 2018-01-23 NOTE — Telephone Encounter (Signed)
-----   Message from Mar Daring, PA-C sent at 01/23/2018 10:31 AM EST ----- Normal breast US, no cancer

## 2018-01-23 NOTE — Telephone Encounter (Signed)
Patient advised as below.  

## 2018-01-24 ENCOUNTER — Encounter: Payer: Self-pay | Admitting: Physician Assistant

## 2018-04-04 ENCOUNTER — Other Ambulatory Visit: Payer: Self-pay | Admitting: Obstetrics & Gynecology

## 2018-04-04 DIAGNOSIS — E041 Nontoxic single thyroid nodule: Secondary | ICD-10-CM

## 2018-04-09 ENCOUNTER — Ambulatory Visit
Admission: RE | Admit: 2018-04-09 | Discharge: 2018-04-09 | Disposition: A | Discharge status: Home / Self Care | Payer: Managed Care, Other (non HMO) | Source: Ambulatory Visit | Attending: Obstetrics & Gynecology | Admitting: Obstetrics & Gynecology

## 2018-04-09 DIAGNOSIS — E041 Nontoxic single thyroid nodule: Secondary | ICD-10-CM

## 2018-05-03 ENCOUNTER — Other Ambulatory Visit: Payer: Self-pay | Admitting: Endocrinology

## 2018-05-03 DIAGNOSIS — E041 Nontoxic single thyroid nodule: Secondary | ICD-10-CM

## 2018-05-08 ENCOUNTER — Other Ambulatory Visit (HOSPITAL_COMMUNITY)
Admission: RE | Admit: 2018-05-08 | Discharge: 2018-05-08 | Disposition: A | Discharge status: Home / Self Care | Payer: Managed Care, Other (non HMO) | Source: Ambulatory Visit | Attending: Interventional Radiology | Admitting: Interventional Radiology

## 2018-05-08 ENCOUNTER — Ambulatory Visit
Admission: RE | Admit: 2018-05-08 | Discharge: 2018-05-08 | Disposition: A | Discharge status: Home / Self Care | Payer: Managed Care, Other (non HMO) | Source: Ambulatory Visit | Attending: Endocrinology | Admitting: Endocrinology

## 2018-05-08 DIAGNOSIS — E041 Nontoxic single thyroid nodule: Secondary | ICD-10-CM

## 2018-07-11 ENCOUNTER — Ambulatory Visit: Payer: Managed Care, Other (non HMO) | Admitting: Endocrinology

## 2018-11-12 ENCOUNTER — Other Ambulatory Visit: Payer: Self-pay

## 2018-11-12 ENCOUNTER — Encounter: Payer: Self-pay | Admitting: Physician Assistant

## 2018-11-12 ENCOUNTER — Ambulatory Visit (INDEPENDENT_AMBULATORY_CARE_PROVIDER_SITE_OTHER): Payer: 59 | Admitting: Physician Assistant

## 2018-11-12 VITALS — BP 107/75 | HR 92 | Temp 98.4°F | Resp 16 | Ht 61.0 in | Wt 119.0 lb

## 2018-11-12 DIAGNOSIS — S46812A Strain of other muscles, fascia and tendons at shoulder and upper arm level, left arm, initial encounter: Secondary | ICD-10-CM

## 2018-11-12 DIAGNOSIS — M62838 Other muscle spasm: Secondary | ICD-10-CM | POA: Diagnosis not present

## 2018-11-12 DIAGNOSIS — Z0000 Encounter for general adult medical examination without abnormal findings: Secondary | ICD-10-CM

## 2018-11-12 MED ORDER — METHYLPREDNISOLONE 4 MG PO TBPK: ORAL_TABLET | ORAL | 0 refills | Status: DC

## 2018-11-12 MED ORDER — BACLOFEN 10 MG PO TABS: 10.0000 mg | ORAL_TABLET | Freq: Three times a day (TID) | ORAL | 0 refills | Status: DC

## 2018-11-12 NOTE — Progress Notes (Signed)
Patient: Robin Griffin, Female    DOB: Mar 29, 1991, 27 y.o.   MRN: [id number] Visit Date: 11/12/2018  Today's Provider: Mar Daring, PA-C   Chief Complaint  Patient presents with  . Annual Exam   Subjective:    Annual physical exam Rosalie Buenaventura is a 27 y.o. female who presents today for health maintenance and complete physical. She feels well. She reports exercising none. She reports she is sleeping well. 11/07/17 CPE 11/03/15 Pap-neg -----------------------------------------------------------------  Patient c/o left arm pain x's 2 weeks. Patient denies any injuries or falls. Patient reports pain is frequent through out the day. Patient reports some swelling on and off. Patient reports rotating hand will ease off pain and streatcing. Patient also c/o shoulder, neck and upper back pain. Patient reports taking OTC medication and reports mild symptom control.   Review of Systems  Constitutional: Negative.   HENT: Negative.   Eyes: Negative.   Respiratory: Negative.   Cardiovascular: Negative.   Gastrointestinal: Negative.   Endocrine: Negative.   Genitourinary: Negative.   Musculoskeletal: Positive for arthralgias and neck stiffness.  Skin: Negative.   Allergic/Immunologic: Negative.   Neurological: Negative.   Hematological: Negative.   Psychiatric/Behavioral: Negative.     Social History      She  reports that she has never smoked. She has never used smokeless tobacco. She reports that she does not drink alcohol or use drugs.       Social History   Socioeconomic History  . Marital status: Married    Spouse name: Not on file  . Number of children: Not on file  . Years of education: Not on file  . Highest education level: Not on file  Occupational History  . Not on file  Social Needs  . Financial resource strain: Not on file  . Food insecurity:    Worry: Not on file    Inability: Not on file  . Transportation needs:    Medical: Not on file    Non-medical: Not on file  Tobacco Use  . Smoking status: Never Smoker  . Smokeless tobacco: Never Used  Substance and Sexual Activity  . Alcohol use: No  . Drug use: No  . Sexual activity: Yes  Lifestyle  . Physical activity:    Days per week: Not on file    Minutes per session: Not on file  . Stress: Not on file  Relationships  . Social connections:    Talks on phone: Not on file    Gets together: Not on file    Attends religious service: Not on file    Active member of club or organization: Not on file    Attends meetings of clubs or organizations: Not on file    Relationship status: Not on file  Other Topics Concern  . Not on file  Social History Narrative  . Not on file    Past Medical History:  Diagnosis Date  . Dermoid cyst of left ovary 08/16/2016  . Headache    migraines stress related while in college  . Seizures ([place])    childhood with fever     Patient Active Problem List   Diagnosis Date Noted  . Dermoid cyst of left ovary 08/16/2016    Past Surgical History:  Procedure Laterality Date  . LAPAROSCOPIC OVARIAN CYSTECTOMY Left 08/16/2016   Procedure: LAPAROSCOPIC OVARIAN CYSTECTOMY;  Surgeon: Azucena Fallen, MD;  Location: [facility];  Service: Gynecology;  Laterality: Left;    Family History  Family Status  Relation Name Status  . Mother  Alive  . Father  Alive  . Sister  Alive  . Brother  Alive  . Mat Uncle  Deceased        Her family history includes Asthma in her maternal uncle; Cancer in her maternal uncle.      No Known Allergies  No current outpatient medications on file.   Patient Care Team: Mar Daring, PA-C as PCP - General (Family Medicine)      Objective:   Vitals: BP 107/75 (BP Location: Left Arm, Patient Position: Sitting, Cuff Size: Normal)   Pulse 92   Temp 98.4 F (36.9 C) (Oral)   Resp 16   Ht 5\' 1"  (1.549 m)   Wt 119 lb (54 kg)   BMI 22.48 kg/m    Vitals:   11/12/18 1611  BP: 107/75  Pulse: 92    Resp: 16  Temp: 98.4 F (36.9 C)  TempSrc: Oral  Weight: 119 lb (54 kg)  Height: 5\' 1"  (1.549 m)     Physical Exam Vitals signs reviewed.  Constitutional:      General: She is not in acute distress.    Appearance: She is well-developed and normal weight. She is not diaphoretic.  HENT:     Head: Normocephalic and atraumatic.     Jaw: There is normal jaw occlusion.     Right Ear: External ear normal.     Left Ear: External ear normal.     Nose: Nose normal.     Mouth/Throat:     Pharynx: No oropharyngeal exudate.  Eyes:     General: No scleral icterus.       Right eye: No discharge.        Left eye: No discharge.     Extraocular Movements: Extraocular movements intact.     Conjunctiva/sclera: Conjunctivae normal.     Pupils: Pupils are equal, round, and reactive to light.  Neck:     Musculoskeletal: Normal range of motion and neck supple.     Thyroid: No thyromegaly.     Vascular: No JVD.     Trachea: No tracheal deviation.  Cardiovascular:     Rate and Rhythm: Normal rate and regular rhythm.     Heart sounds: Normal heart sounds. No murmur. No friction rub. No gallop.   Pulmonary:     Effort: Pulmonary effort is normal. No respiratory distress.     Breath sounds: Normal breath sounds. No wheezing or rales.  Chest:     Chest wall: No tenderness.     Breasts:        Right: Mass present. No nipple discharge or skin change.        Left: Normal. No mass, nipple discharge or skin change.    Abdominal:     General: Bowel sounds are normal. There is no distension.     Palpations: Abdomen is soft. There is no mass.     Tenderness: There is no abdominal tenderness. There is no guarding or rebound.  Musculoskeletal: Normal range of motion.        General: No tenderness.  Lymphadenopathy:     Cervical: No cervical adenopathy.     Upper Body:     Right upper body: No supraclavicular, axillary or pectoral adenopathy.     Left upper body: No supraclavicular, axillary or  pectoral adenopathy.  Skin:    General: Skin is warm and dry.     Findings: No rash.  Neurological:  Mental Status: She is alert and oriented to person, place, and time.  Psychiatric:        Behavior: Behavior normal.        Thought Content: Thought content normal.        Judgment: Judgment normal.      Depression Screen PHQ 2/9 Scores 11/12/2018 11/07/2017  PHQ - 2 Score 0 0  PHQ- 9 Score 0 0      Assessment & Plan:     Routine Health Maintenance and Physical Exam  Exercise Activities and Dietary recommendations Goals   None     Immunization History  Administered Date(s) Administered  . Influenza,inj,Quad PF,6+ Mos 11/03/2016  . Influenza-Unspecified 10/06/2015, 09/20/2017    Health Maintenance  Topic Date Due  . TETANUS/TDAP  05/07/2008  . PAP-Cervical Cytology Screening  05/07/2010  . INFLUENZA VACCINE  06/28/2018  . PAP SMEAR-Modifier  11/02/2018  . HIV Screening  Completed     Discussed health benefits of physical activity, and encouraged her to engage in regular exercise appropriate for her age and condition.    1. Annual physical exam Normal physical exam today. Will check labs as below and f/u pending lab results. If labs are stable and WNL she will not need to have these rechecked for one year at her next annual physical exam. She is to call the office in the meantime if she has any acute issue, questions or concerns. - CBC w/Diff/Platelet - Comprehensive Metabolic Panel (CMET) - TSH - Lipid Profile  2. Muscle spasm Worsening muscle tension and spasm noted in the upper trapezius muscle bilaterally. This is causing some referred pain and early tendinitis in the deltoid insertion on the left. Will treat with medrol dose pak and baclofen as below. Referral made to chiropractor at request of the patient. If no symptoms improving will consider imaging and PT. Call if no improvements.  - methylPREDNISolone (MEDROL) 4 MG TBPK tablet; 6 day taper; take  as directed on package instructions  Dispense: 21 tablet; Refill: 0 - baclofen (LIORESAL) 10 MG tablet; Take 1 tablet (10 mg total) by mouth 3 (three) times daily.  Dispense: 30 each; Refill: 0 - Ambulatory referral to Chiropractic  3. Strain of left trapezius muscle, initial encounter See above medical treatment plan. - methylPREDNISolone (MEDROL) 4 MG TBPK tablet; 6 day taper; take as directed on package instructions  Dispense: 21 tablet; Refill: 0 - baclofen (LIORESAL) 10 MG tablet; Take 1 tablet (10 mg total) by mouth 3 (three) times daily.  Dispense: 30 each; Refill: 0 - Ambulatory referral to Chiropractic  4. Strain of left deltoid muscle, initial encounter See above medical treatment plan. - methylPREDNISolone (MEDROL) 4 MG TBPK tablet; 6 day taper; take as directed on package instructions  Dispense: 21 tablet; Refill: 0 - baclofen (LIORESAL) 10 MG tablet; Take 1 tablet (10 mg total) by mouth 3 (three) times daily.  Dispense: 30 each; Refill: 0 - Ambulatory referral to Chiropractic  --------------------------------------------------------------------    Mar Daring, PA-C  [facility]

## 2018-11-12 NOTE — Patient Instructions (Signed)
Back Exercises The following exercises strengthen the muscles that help to support the back. They also help to keep the lower back flexible. Doing these exercises can help to prevent back pain or lessen existing pain. If you have back pain or discomfort, try doing these exercises 2-3 times each day or as told by your health care provider. When the pain goes away, do them once each day, but increase the number of times that you repeat the steps for each exercise (do more repetitions). If you do not have back pain or discomfort, do these exercises once each day or as told by your health care provider. Exercises Single Knee to Chest  Repeat these steps 3-5 times for each leg: 1. Lie on your back on a firm bed or the floor with your legs extended. 2. Bring one knee to your chest. Your other leg should stay extended and in contact with the floor. 3. Hold your knee in place by grabbing your knee or thigh. 4. Pull on your knee until you feel a gentle stretch in your lower back. 5. Hold the stretch for 10-30 seconds. 6. Slowly release and straighten your leg.  Pelvic Tilt  Repeat these steps 5-10 times: 1. Lie on your back on a firm bed or the floor with your legs extended. 2. Bend your knees so they are pointing toward the ceiling and your feet are flat on the floor. 3. Tighten your lower abdominal muscles to press your lower back against the floor. This motion will tilt your pelvis so your tailbone points up toward the ceiling instead of pointing to your feet or the floor. 4. With gentle tension and even breathing, hold this position for 5-10 seconds.  Cat-Cow  Repeat these steps until your lower back becomes more flexible: 1. Get into a hands-and-knees position on a firm surface. Keep your hands under your shoulders, and keep your knees under your hips. You may place padding under your knees for comfort. 2. Let your head hang down, and point your tailbone toward the floor so your lower back  becomes rounded like the back of a cat. 3. Hold this position for 5 seconds. 4. Slowly lift your head and point your tailbone up toward the ceiling so your back forms a sagging arch like the back of a cow. 5. Hold this position for 5 seconds.  Press-Ups  Repeat these steps 5-10 times: 1. Lie on your abdomen (face-down) on the floor. 2. Place your palms near your head, about shoulder-width apart. 3. While you keep your back as relaxed as possible and keep your hips on the floor, slowly straighten your arms to raise the top half of your body and lift your shoulders. Do not use your back muscles to raise your upper torso. You may adjust the placement of your hands to make yourself more comfortable. 4. Hold this position for 5 seconds while you keep your back relaxed. 5. Slowly return to lying flat on the floor.  Bridges  Repeat these steps 10 times: 1. Lie on your back on a firm surface. 2. Bend your knees so they are pointing toward the ceiling and your feet are flat on the floor. 3. Tighten your buttocks muscles and lift your buttocks off of the floor until your waist is at almost the same height as your knees. You should feel the muscles working in your buttocks and the back of your thighs. If you do not feel these muscles, slide your feet 1-2 inches farther away   from your buttocks. 4. Hold this position for 3-5 seconds. 5. Slowly lower your hips to the starting position, and allow your buttocks muscles to relax completely.  If this exercise is too easy, try doing it with your arms crossed over your chest. Abdominal Crunches  Repeat these steps 5-10 times: 1. Lie on your back on a firm bed or the floor with your legs extended. 2. Bend your knees so they are pointing toward the ceiling and your feet are flat on the floor. 3. Cross your arms over your chest. 4. Tip your chin slightly toward your chest without bending your neck. 5. Tighten your abdominal muscles and slowly raise your  trunk (torso) high enough to lift your shoulder blades a tiny bit off of the floor. Avoid raising your torso higher than that, because it can put too much stress on your low back and it does not help to strengthen your abdominal muscles. 6. Slowly return to your starting position.  Back Lifts Repeat these steps 5-10 times: 1. Lie on your abdomen (face-down) with your arms at your sides, and rest your forehead on the floor. 2. Tighten the muscles in your legs and your buttocks. 3. Slowly lift your chest off of the floor while you keep your hips pressed to the floor. Keep the back of your head in line with the curve in your back. Your eyes should be looking at the floor. 4. Hold this position for 3-5 seconds. 5. Slowly return to your starting position.  Contact a health care provider if:  Your back pain or discomfort gets much worse when you do an exercise.  Your back pain or discomfort does not lessen within 2 hours after you exercise. If you have any of these problems, stop doing these exercises right away. Do not do them again unless your health care provider says that you can. Get help right away if:  You develop sudden, severe back pain. If this happens, stop doing the exercises right away. Do not do them again unless your health care provider says that you can. This information is not intended to replace advice given to you by your health care provider. Make sure you discuss any questions you have with your health care provider. Document Released: 12/22/2004 Document Revised: 03/23/2016 Document Reviewed: 01/08/2015 Elsevier Interactive Patient Education  2017 [employer] Neck Exercises Neck exercises can be important for many reasons:  They can help you to improve and maintain flexibility in your neck. This can be especially important as you age.  They can help to make your neck stronger. This can make movement easier.  They can reduce or prevent neck pain.  They may help your  upper back.  Ask your health care provider which neck exercises would be best for you. Exercises Neck Press Repeat this exercise 10 times. Do it first thing in the morning and right before bed or as told by your health care provider. 1. Lie on your back on a firm bed or on the floor with a pillow under your head. 2. Use your neck muscles to push your head down on the pillow and straighten your spine. 3. Hold the position as well as you can. Keep your head facing up and your chin tucked. 4. Slowly count to 5 while holding this position. 5. Relax for a few seconds. Then repeat.  Isometric Strengthening Do a full set of these exercises 2 times a day or as told by your health care provider. 1. Sit in  a supportive chair and place your hand on your forehead. 2. Push forward with your head and neck while pushing back with your hand. Hold for 10 seconds. 3. Relax. Then repeat the exercise 3 times. 4. Next, do thesequence again, this time putting your hand against the back of your head. Use your head and neck to push backward against the hand pressure. 5. Finally, do the same exercise on either side of your head, pushing sideways against the pressure of your hand.  Prone Head Lifts Repeat this exercise 5 times. Do this 2 times a day or as told by your health care provider. 1. Lie face-down, resting on your elbows so that your chest and upper back are raised. 2. Start with your head facing downward, near your chest. Position your chin either on or near your chest. 3. Slowly lift your head upward. Lift until you are looking straight ahead. Then continue lifting your head as far back as you can stretch. 4. Hold your head up for 5 seconds. Then slowly lower it to your starting position.  Supine Head Lifts Repeat this exercise 8-10 times. Do this 2 times a day or as told by your health care provider. 1. Lie on your back, bending your knees to point to the ceiling and keeping your feet flat on the  floor. 2. Lift your head slowly off the floor, raising your chin toward your chest. 3. Hold for 5 seconds. 4. Relax and repeat.  Scapular Retraction Repeat this exercise 5 times. Do this 2 times a day or as told by your health care provider. 1. Stand with your arms at your sides. Look straight ahead. 2. Slowly pull both shoulders backward and downward until you feel a stretch between your shoulder blades in your upper back. 3. Hold for 10-30 seconds. 4. Relax and repeat.  Contact a health care provider if:  Your neck pain or discomfort gets much worse when you do an exercise.  Your neck pain or discomfort does not improve within 2 hours after you exercise. If you have any of these problems, stop exercising right away. Do not do the exercises again unless your health care provider says that you can. Get help right away if:  You develop sudden, severe neck pain. If this happens, stop exercising right away. Do not do the exercises again unless your health care provider says that you can. Exercises Neck Stretch  Repeat this exercise 3-5 times. 1. Do this exercise while standing or while sitting in a chair. 2. Place your feet flat on the floor, shoulder-width apart. 3. Slowly turn your head to the right. Turn it all the way to the right so you can look over your right shoulder. Do not tilt or tip your head. 4. Hold this position for 10-30 seconds. 5. Slowly turn your head to the left, to look over your left shoulder. 6. Hold this position for 10-30 seconds.  Neck Retraction Repeat this exercise 8-10 times. Do this 3-4 times a day or as told by your health care provider. 1. Do this exercise while standing or while sitting in a sturdy chair. 2. Look straight ahead. Do not bend your neck. 3. Use your fingers to push your chin backward. Do not bend your neck for this movement. Continue to face straight ahead. If you are doing the exercise properly, you will feel a slight sensation in your  throat and a stretch at the back of your neck. 4. Hold the stretch for 1-2 seconds. Relax  and repeat.  This information is not intended to replace advice given to you by your health care provider. Make sure you discuss any questions you have with your health care provider. Document Released: 10/26/2015 Document Revised: 04/21/2016 Document Reviewed: 05/25/2015 Elsevier Interactive Patient Education  [employer].

## 2018-11-14 ENCOUNTER — Ambulatory Visit: Payer: 59 | Admitting: Physician Assistant

## 2018-11-14 ENCOUNTER — Encounter: Payer: Self-pay | Admitting: Physician Assistant

## 2018-11-14 VITALS — BP 113/75 | HR 108 | Temp 98.1°F | Resp 16 | Wt 122.0 lb

## 2018-11-14 DIAGNOSIS — N3090 Cystitis, unspecified without hematuria: Secondary | ICD-10-CM | POA: Diagnosis not present

## 2018-11-14 DIAGNOSIS — R300 Dysuria: Secondary | ICD-10-CM

## 2018-11-14 LAB — POCT URINALYSIS DIPSTICK
BILIRUBIN UA: NEGATIVE
GLUCOSE UA: NEGATIVE
KETONES UA: NEGATIVE
NITRITE UA: NEGATIVE
Protein, UA: NEGATIVE
Spec Grav, UA: 1.015 (ref 1.010–1.025)
Urobilinogen, UA: 0.2 E.U./dL
pH, UA: 6.5 (ref 5.0–8.0)

## 2018-11-14 MED ORDER — CEPHALEXIN 500 MG PO CAPS: 500.0000 mg | ORAL_CAPSULE | Freq: Two times a day (BID) | ORAL | 0 refills | Status: DC

## 2018-11-14 NOTE — Patient Instructions (Signed)
Urinary Tract Infection, Adult A urinary tract infection (UTI) is an infection of any part of the urinary tract. The urinary tract includes:  The kidneys.  The ureters.  The bladder.  The urethra. These organs make, store, and get rid of pee (urine) in the body. What are the causes? This is caused by germs (bacteria) in your genital area. These germs grow and cause swelling (inflammation) of your urinary tract. What increases the risk? You are more likely to develop this condition if:  You have a small, thin tube (catheter) to drain pee.  You cannot control when you pee or poop (incontinence).  You are female, and: ? You use these methods to prevent pregnancy: ? A medicine that kills sperm (spermicide). ? A device that blocks sperm (diaphragm). ? You have low levels of a female hormone (estrogen). ? You are pregnant.  You have genes that add to your risk.  You are sexually active.  You take antibiotic medicines.  You have trouble peeing because of: ? A prostate that is bigger than normal, if you are female. ? A blockage in the part of your body that drains pee from the bladder (urethra). ? A kidney stone. ? A nerve condition that affects your bladder (neurogenic bladder). ? Not getting enough to drink. ? Not peeing often enough.  You have other conditions, such as: ? Diabetes. ? A weak disease-fighting system (immune system). ? Sickle cell disease. ? Gout. ? Injury of the spine. What are the signs or symptoms? Symptoms of this condition include:  Needing to pee right away (urgently).  Peeing often.  Peeing small amounts often.  Pain or burning when peeing.  Blood in the pee.  Pee that smells bad or not like normal.  Trouble peeing.  Pee that is cloudy.  Fluid coming from the vagina, if you are female.  Pain in the belly or lower back. Other symptoms include:  Throwing up (vomiting).  No urge to eat.  Feeling mixed up (confused).  Being tired  and grouchy (irritable).  A fever.  Watery poop (diarrhea). How is this treated? This condition may be treated with:  Antibiotic medicine.  Other medicines.  Drinking enough water. Follow these instructions at home:  Medicines  Take over-the-counter and prescription medicines only as told by your doctor.  If you were prescribed an antibiotic medicine, take it as told by your doctor. Do not stop taking it even if you start to feel better. General instructions  Make sure you: ? Pee until your bladder is empty. ? Do not hold pee for a long time. ? Empty your bladder after sex. ? Wipe from front to back after pooping if you are a female. Use each tissue one time when you wipe.  Drink enough fluid to keep your pee pale yellow.  Keep all follow-up visits as told by your doctor. This is important. Contact a doctor if:  You do not get better after 1-2 days.  Your symptoms go away and then come back. Get help right away if:  You have very bad back pain.  You have very bad pain in your lower belly.  You have a fever.  You are sick to your stomach (nauseous).  You are throwing up. Summary  A urinary tract infection (UTI) is an infection of any part of the urinary tract.  This condition is caused by germs in your genital area.  There are many risk factors for a UTI. These include having a small, thin   tube to drain pee and not being able to control when you pee or poop.  Treatment includes antibiotic medicines for germs.  Drink enough fluid to keep your pee pale yellow. This information is not intended to replace advice given to you by your health care provider. Make sure you discuss any questions you have with your health care provider. Document Released: 05/02/2008 Document Revised: 05/24/2018 Document Reviewed: 05/24/2018 Elsevier Interactive Patient Education  2019 [employer] Inc.  

## 2018-11-14 NOTE — Progress Notes (Signed)
       Patient: Robin Griffin Female    DOB: 1991-10-04   27 y.o.   MRN: [id number] Visit Date: 11/14/2018  Today's Provider: Mar Daring, PA-C   Chief Complaint  Patient presents with  . Urinary Tract Infection   Subjective:     HPI Urinary Tract Infection: Patient complains of burning with urination and hematuria She has had symptoms for 2 months, intermittently. Patient also complains of back pain. Patient denies fever. Patient does have a history of recurrent UTI.  Patient does not have a history of pyelonephritis.   No Known Allergies   Current Outpatient Medications:  .  baclofen (LIORESAL) 10 MG tablet, Take 1 tablet (10 mg total) by mouth 3 (three) times daily., Disp: 30 each, Rfl: 0 .  methylPREDNISolone (MEDROL) 4 MG TBPK tablet, 6 day taper; take as directed on package instructions, Disp: 21 tablet, Rfl: 0  Review of Systems  Constitutional: Negative.   Respiratory: Negative.   Cardiovascular: Negative.   Gastrointestinal: Negative.   Genitourinary: Positive for dysuria and hematuria.    Social History   Tobacco Use  . Smoking status: Never Smoker  . Smokeless tobacco: Never Used  Substance Use Topics  . Alcohol use: No      Objective:   BP 113/75 (BP Location: Left Arm, Patient Position: Sitting, Cuff Size: Normal)   Pulse (!) 108   Temp 98.1 F (36.7 C) (Oral)   Resp 16   Wt 122 lb (55.3 kg)   BMI 23.05 kg/m  Vitals:   11/14/18 1517  BP: 113/75  Pulse: (!) 108  Resp: 16  Temp: 98.1 F (36.7 C)  TempSrc: Oral  Weight: 122 lb (55.3 kg)     Physical Exam Vitals signs reviewed.  Constitutional:      General: She is not in acute distress.    Appearance: Normal appearance. She is well-developed. She is not diaphoretic.  Cardiovascular:     Rate and Rhythm: Normal rate and regular rhythm.     Heart sounds: Normal heart sounds. No murmur. No friction rub. No gallop.   Pulmonary:     Effort: Pulmonary effort is normal. No  respiratory distress.     Breath sounds: Normal breath sounds. No wheezing or rales.  Abdominal:     General: Bowel sounds are normal. There is no distension.     Palpations: Abdomen is soft. There is no mass.     Tenderness: There is abdominal tenderness in the suprapubic area. There is no guarding or rebound.  Skin:    General: Skin is warm and dry.  Neurological:     Mental Status: She is alert and oriented to person, place, and time.        Assessment & Plan    1. Cystitis Worsening symptoms. UA positive. Will treat empirically with keflex. Continue to push fluids. Urine sent for culture. Will follow up pending C&S results. She is to call if symptoms do not improve or if they worsen.  - Urine Culture - cephALEXin (KEFLEX) 500 MG capsule; Take 1 capsule (500 mg total) by mouth 2 (two) times daily.  Dispense: 10 capsule; Refill: 0  2. Dysuria UA positive. - POCT urinalysis dipstick     Mar Daring, PA-C  [facility]

## 2018-11-15 LAB — LIPID PANEL
CHOLESTEROL TOTAL: 168 mg/dL (ref 100–199)
Chol/HDL Ratio: 3.6 ratio (ref 0.0–4.4)
HDL: 47 mg/dL (ref >39)
LDL Calculated: 105 mg/dL — ABNORMAL HIGH (ref 0–99)
TRIGLYCERIDES: 78 mg/dL (ref 0–149)
VLDL CHOLESTEROL CAL: 16 mg/dL (ref 5–40)

## 2018-11-15 LAB — CBC WITH DIFFERENTIAL/PLATELET
BASOS ABS: 0.1 10*3/uL (ref 0.0–0.2)
Basos: 1 %
EOS (ABSOLUTE): 0.1 10*3/uL (ref 0.0–0.4)
Eos: 2 %
HEMOGLOBIN: 11.9 g/dL (ref 11.1–15.9)
Hematocrit: 35.1 % (ref 34.0–46.6)
Immature Grans (Abs): 0 10*3/uL (ref 0.0–0.1)
Immature Granulocytes: 0 %
LYMPHS ABS: 2.7 10*3/uL (ref 0.7–3.1)
Lymphs: 31 %
MCH: 29.1 pg (ref 26.6–33.0)
MCHC: 33.9 g/dL (ref 31.5–35.7)
MCV: 86 fL (ref 79–97)
MONOCYTES: 7 %
Monocytes Absolute: 0.6 10*3/uL (ref 0.1–0.9)
NEUTROS ABS: 5.2 10*3/uL (ref 1.4–7.0)
Neutrophils: 59 %
PLATELETS: 236 10*3/uL (ref 150–450)
RBC: 4.09 x10E6/uL (ref 3.77–5.28)
RDW: 12.2 % — AB (ref 12.3–15.4)
WBC: 8.7 10*3/uL (ref 3.4–10.8)

## 2018-11-15 LAB — TSH: TSH: 0.88 u[IU]/mL (ref 0.450–4.500)

## 2018-11-15 LAB — COMPREHENSIVE METABOLIC PANEL
ALBUMIN: 4.2 g/dL (ref 3.5–5.5)
ALK PHOS: 67 IU/L (ref 39–117)
ALT: 13 IU/L (ref 0–32)
AST: 18 IU/L (ref 0–40)
Albumin/Globulin Ratio: 1.9 (ref 1.2–2.2)
BUN / CREAT RATIO: 15 (ref 9–23)
BUN: 11 mg/dL (ref 6–20)
Bilirubin Total: 0.2 mg/dL (ref 0.0–1.2)
CHLORIDE: 104 mmol/L (ref 96–106)
CO2: 24 mmol/L (ref 20–29)
Calcium: 8.7 mg/dL (ref 8.7–10.2)
Creatinine, Ser: 0.75 mg/dL (ref 0.57–1.00)
GFR calc Af Amer: 125 mL/min/{1.73_m2} (ref >59)
GFR calc non Af Amer: 108 mL/min/{1.73_m2} (ref >59)
GLUCOSE: 95 mg/dL (ref 65–99)
Globulin, Total: 2.2 g/dL (ref 1.5–4.5)
Potassium: 4.1 mmol/L (ref 3.5–5.2)
Sodium: 140 mmol/L (ref 134–144)
Total Protein: 6.4 g/dL (ref 6.0–8.5)

## 2018-11-16 ENCOUNTER — Telehealth: Payer: Self-pay

## 2018-11-16 LAB — URINE CULTURE

## 2018-11-16 NOTE — Telephone Encounter (Signed)
-----   Message from Mar Daring, PA-C sent at 11/16/2018  8:34 AM EST ----- All labs are within normal limits and stable.  Thanks! -JB

## 2018-11-16 NOTE — Telephone Encounter (Signed)
-----   Message from Mar Daring, PA-C sent at 11/16/2018  1:51 PM EST ----- Urine culture positive for e. Coli. Is susceptible to antibiotic you were placed on. Continue until completed. Call if symptoms do not resolve or if they return.

## 2018-11-16 NOTE — Telephone Encounter (Signed)
Patient husband Robin Griffin was advised due to patient's language barrier.

## 2018-11-16 NOTE — Telephone Encounter (Signed)
Patient husband Robin Griffin was advised.

## 2018-11-16 NOTE — Telephone Encounter (Signed)
[id number] 

## 2019-06-11 IMAGING — US US FNA BIOPSY THYROID 1ST LESION
1 series · 13 of 15 positions shown · non-contrast
Comparison: 04/09/2018

MEDICATIONS:
1% lidocaine local

COMPLICATIONS:
None immediate.

INDICATION: Indeterminate thyroid nodule

EXAM:
ULTRASOUND GUIDED FINE NEEDLE ASPIRATION OF INDETERMINATE THYROID
NODULE
TECHNIQUE: Informed written consent was obtained from the patient after a
discussion of the risks, benefits and alternatives to treatment.
Questions regarding the procedure were encouraged and answered. A
timeout was performed prior to the initiation of the procedure.

[Series 1: us fna biopsy thyroid 1st lesion · 0.06mm/px · 15 acquisitions, 13 frames shown]
[im 1/15]
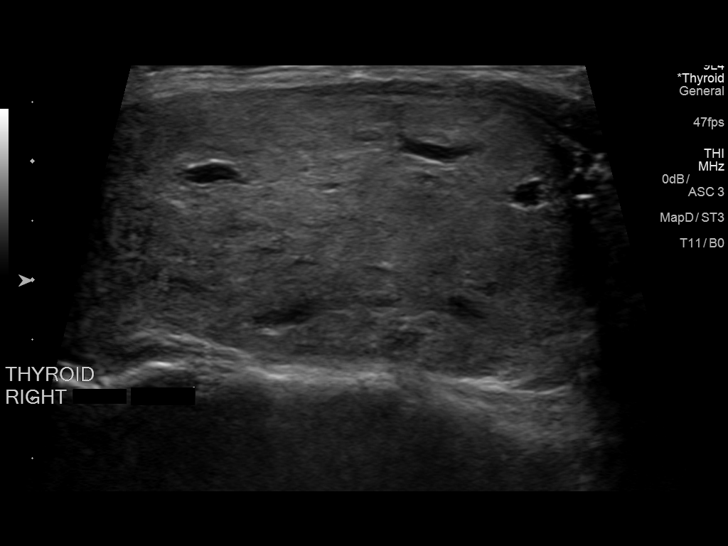
[im 2/15]
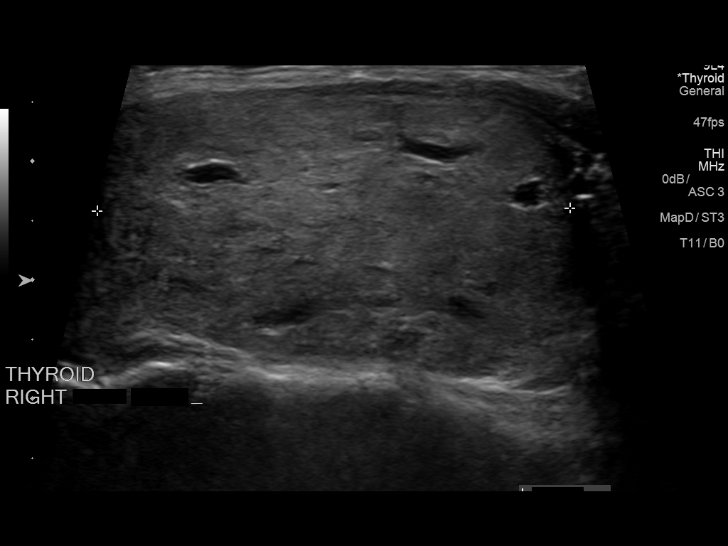
[im 3/15]
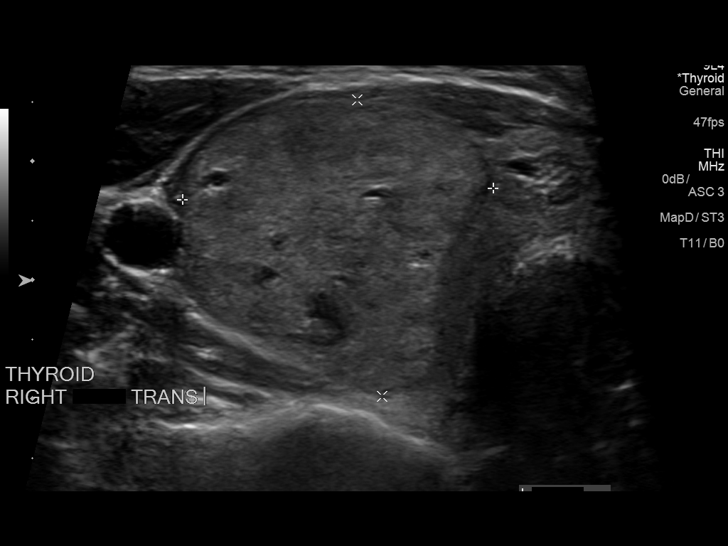
[im 5/15]
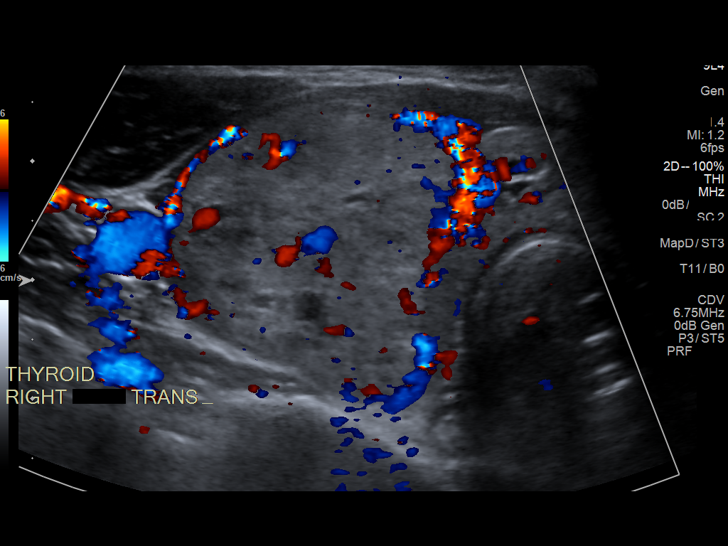
[im 6/15]
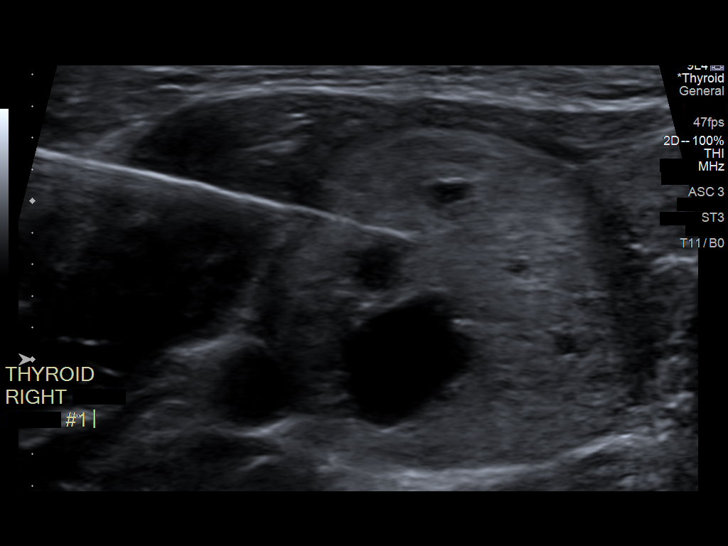
[im 7/15]
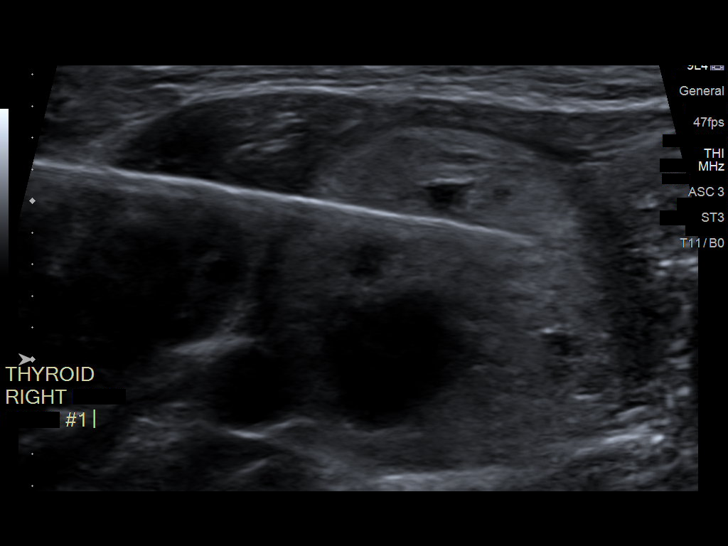
[im 8/15]
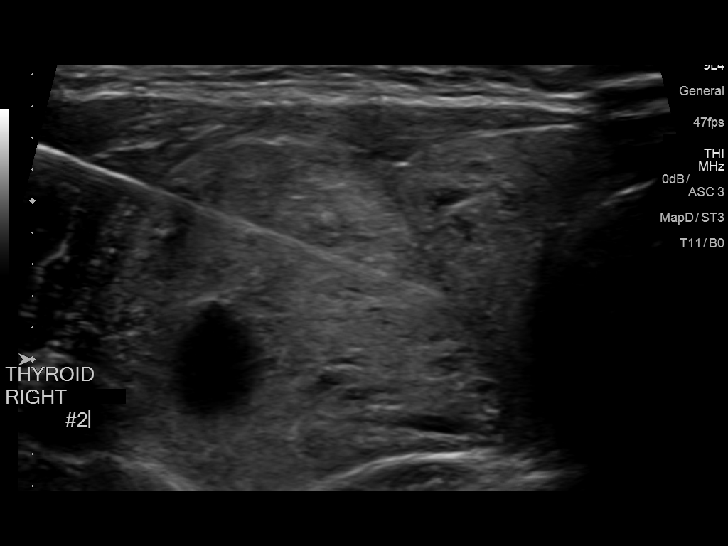
[im 9/15]
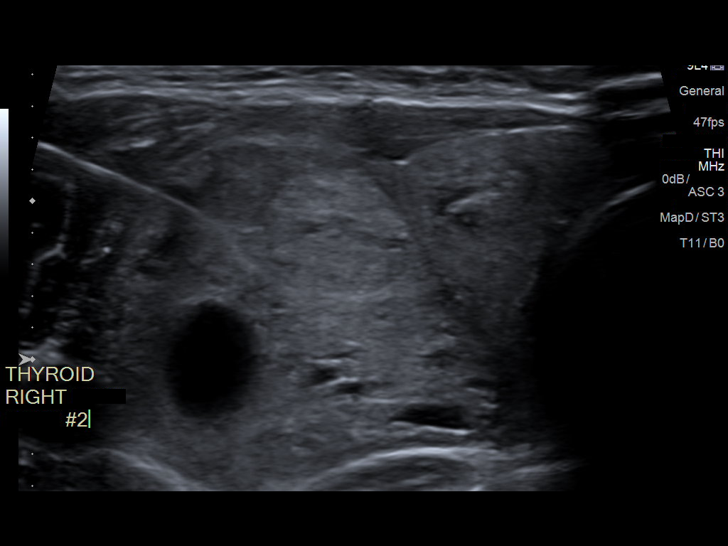
[im 10/15]
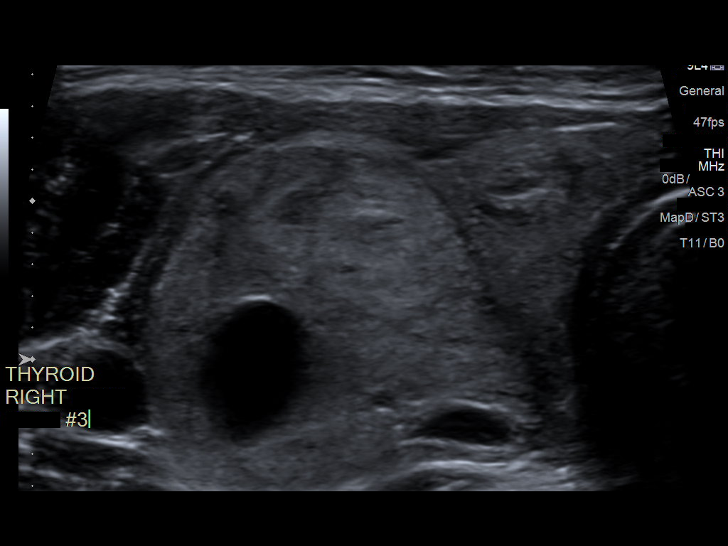
[im 11/15]
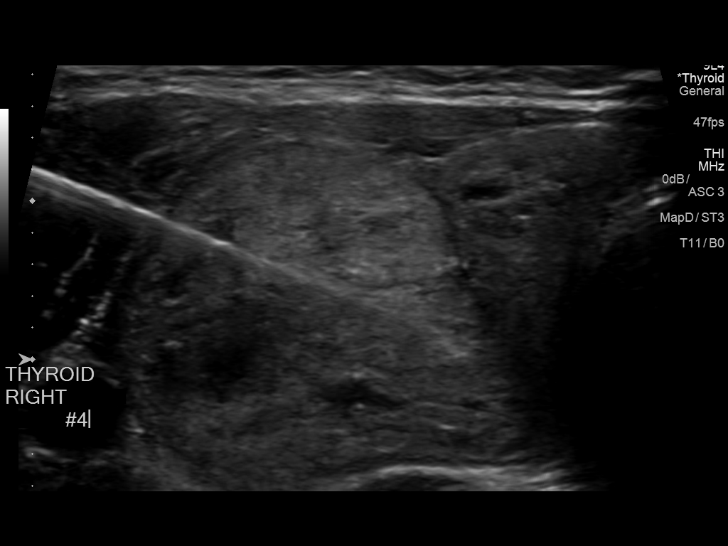
[im 13/15]
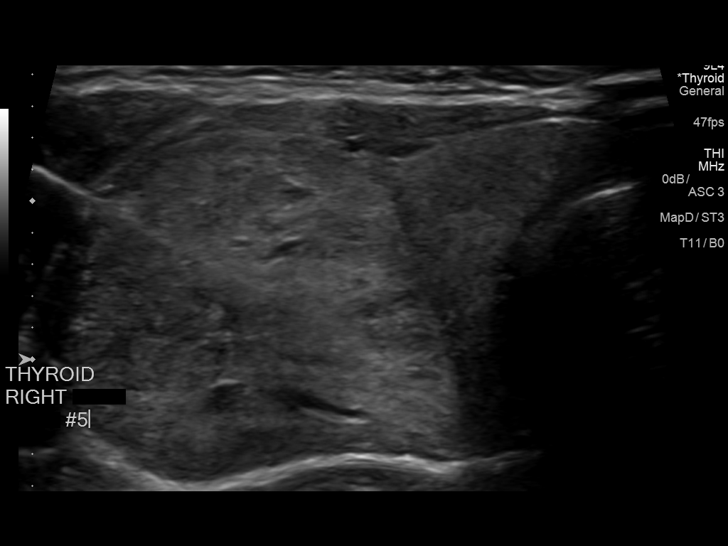
[im 14/15]
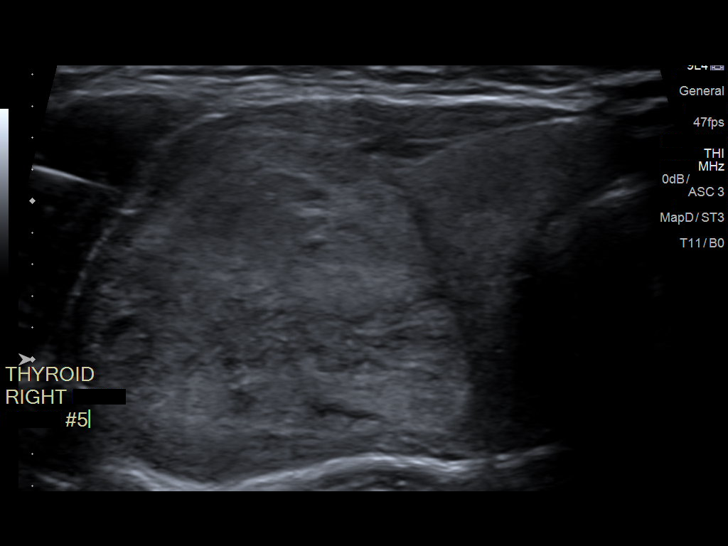
[im 15/15]
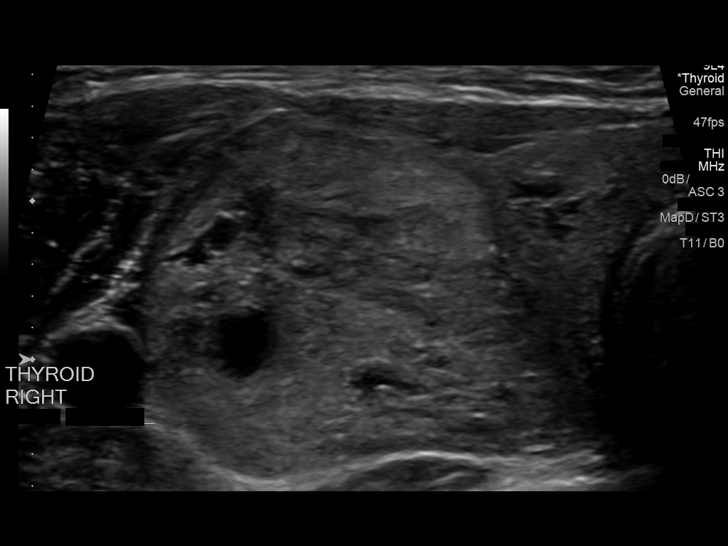

[13 of 15 positions shown; findings below may reference images not displayed]

Pre-procedural ultrasound scanning demonstrated unchanged size and
appearance of the indeterminate nodule within the right inferior
thyroid

The procedure was planned. The neck was prepped in the usual sterile
fashion, and a sterile drape was applied covering the operative
field. A timeout was performed prior to the initiation of the
procedure. Local anesthesia was provided with 1% lidocaine.

Under direct ultrasound guidance, 5 FNA biopsies were performed of
the 3.8 cm right inferior TR 4 nodule with a 25 gauge needle.
Multiple ultrasound images were saved for procedural documentation
purposes. The samples were prepared and submitted to pathology.

Limited post procedural scanning was negative for hematoma or
additional complication. Dressings were placed. The patient
tolerated the above procedures procedure well without immediate
postprocedural complication.
FINDINGS: Nodule reference number based on prior diagnostic ultrasound: 3

Maximum size: 3.8 cm

Location: Right; Inferior

ACR TI-RADS risk category: TR4 (4-6 points)

Reason for biopsy: meets ACR TI-RADS criteria

Ultrasound imaging confirms appropriate placement of the needles
within the thyroid nodule.
IMPRESSION: Technically successful ultrasound guided fine needle aspiration of
3.8 cm right inferior TR 4 nodule

## 2019-08-21 ENCOUNTER — Other Ambulatory Visit: Payer: Self-pay | Admitting: Endocrinology

## 2019-08-21 DIAGNOSIS — E041 Nontoxic single thyroid nodule: Secondary | ICD-10-CM

## 2019-08-27 ENCOUNTER — Ambulatory Visit
Admission: RE | Admit: 2019-08-27 | Discharge: 2019-08-27 | Disposition: A | Discharge status: Home / Self Care | Payer: Managed Care, Other (non HMO) | Source: Ambulatory Visit | Attending: Endocrinology | Admitting: Endocrinology

## 2019-08-27 DIAGNOSIS — E041 Nontoxic single thyroid nodule: Secondary | ICD-10-CM

## 2019-10-28 ENCOUNTER — Other Ambulatory Visit: Payer: Self-pay | Admitting: Endocrinology

## 2019-10-28 DIAGNOSIS — E041 Nontoxic single thyroid nodule: Secondary | ICD-10-CM

## 2019-11-07 ENCOUNTER — Other Ambulatory Visit (HOSPITAL_COMMUNITY)
Admission: RE | Admit: 2019-11-07 | Discharge: 2019-11-07 | Disposition: A | Discharge status: Home / Self Care | Payer: 59 | Source: Ambulatory Visit | Attending: Radiology | Admitting: Radiology

## 2019-11-07 ENCOUNTER — Ambulatory Visit
Admission: RE | Admit: 2019-11-07 | Discharge: 2019-11-07 | Disposition: A | Discharge status: Home / Self Care | Payer: 59 | Source: Ambulatory Visit | Attending: Endocrinology | Admitting: Endocrinology

## 2019-11-07 DIAGNOSIS — E041 Nontoxic single thyroid nodule: Secondary | ICD-10-CM

## 2019-11-08 LAB — CYTOLOGY - NON PAP

## 2019-11-19 ENCOUNTER — Encounter: Payer: Self-pay | Admitting: Physician Assistant

## 2019-11-19 ENCOUNTER — Ambulatory Visit (INDEPENDENT_AMBULATORY_CARE_PROVIDER_SITE_OTHER): Payer: 59 | Admitting: Physician Assistant

## 2019-11-19 ENCOUNTER — Other Ambulatory Visit: Payer: Self-pay

## 2019-11-19 VITALS — BP 105/68 | HR 107 | Temp 97.2°F | Resp 18 | Ht 62.0 in | Wt 127.4 lb

## 2019-11-19 DIAGNOSIS — L210 Seborrhea capitis: Secondary | ICD-10-CM | POA: Diagnosis not present

## 2019-11-19 DIAGNOSIS — K119 Disease of salivary gland, unspecified: Secondary | ICD-10-CM | POA: Diagnosis not present

## 2019-11-19 DIAGNOSIS — R590 Localized enlarged lymph nodes: Secondary | ICD-10-CM

## 2019-11-19 DIAGNOSIS — Z0000 Encounter for general adult medical examination without abnormal findings: Secondary | ICD-10-CM | POA: Diagnosis not present

## 2019-11-19 MED ORDER — KETOCONAZOLE 1 % EX SHAM: MEDICATED_SHAMPOO | CUTANEOUS | 1 refills | Status: DC

## 2019-11-19 NOTE — Progress Notes (Signed)
Patient: Robin Griffin, Female    DOB: 07-08-1991, 28 y.o.   MRN: [id number] Visit Date: 11/19/2019  Today's Provider: Mar Daring, PA-C   Chief Complaint  Patient presents with  . Annual Exam   Subjective:     Annual physical exam Robin Griffin is a 28 y.o. female who presents today for health maintenance and complete physical. She feels well. She reports exercising not like she should. She reports she is sleeping well.  Pap : 11/03/2015 Negative Mammogram : 01/23/2018 Bi-Rads 1 -----------------------------------------------------------------  Review of Systems  Constitutional: Negative.   HENT: Positive for ear pain and sneezing (in the morning). Negative for congestion, ear discharge, hearing loss and tinnitus.   Eyes: Negative.   Respiratory: Negative.   Cardiovascular: Positive for chest pain (right side in axilla radiates to right breast). Negative for palpitations and leg swelling.  Gastrointestinal: Negative.   Endocrine: Negative.   Genitourinary: Negative.   Musculoskeletal: Negative.   Skin: Negative.   Allergic/Immunologic: Negative.   Neurological: Negative.   Hematological: Negative.   Psychiatric/Behavioral: Negative.     Social History      She  reports that she has never smoked. She has never used smokeless tobacco. She reports that she does not drink alcohol or use drugs.       Social History   Socioeconomic History  . Marital status: Married    Spouse name: Not on file  . Number of children: Not on file  . Years of education: Not on file  . Highest education level: Not on file  Occupational History  . Not on file  Tobacco Use  . Smoking status: Never Smoker  . Smokeless tobacco: Never Used  Substance and Sexual Activity  . Alcohol use: No  . Drug use: No  . Sexual activity: Yes  Other Topics Concern  . Not on file  Social History Narrative  . Not on file   Social Determinants of Health   Financial Resource  Strain:   . Difficulty of Paying Living Expenses: Not on file  Food Insecurity:   . Worried About Charity fundraiser in the Last Year: Not on file  . Ran Out of Food in the Last Year: Not on file  Transportation Needs:   . Lack of Transportation (Medical): Not on file  . Lack of Transportation (Non-Medical): Not on file  Physical Activity:   . Days of Exercise per Week: Not on file  . Minutes of Exercise per Session: Not on file  Stress:   . Feeling of Stress : Not on file  Social Connections:   . Frequency of Communication with Friends and Family: Not on file  . Frequency of Social Gatherings with Friends and Family: Not on file  . Attends Religious Services: Not on file  . Active Member of Clubs or Organizations: Not on file  . Attends Archivist Meetings: Not on file  . Marital Status: Not on file    Past Medical History:  Diagnosis Date  . Dermoid cyst of left ovary 08/16/2016  . Headache    migraines stress related while in college  . Seizures ([place])    childhood with fever     Patient Active Problem List   Diagnosis Date Noted  . Dermoid cyst of left ovary 08/16/2016    Past Surgical History:  Procedure Laterality Date  . LAPAROSCOPIC OVARIAN CYSTECTOMY Left 08/16/2016   Procedure: LAPAROSCOPIC OVARIAN CYSTECTOMY;  Surgeon: Azucena Fallen, MD;  Location: [facility];  Service: Gynecology;  Laterality: Left;    Family History        Family Status  Relation Name Status  . Mother  Alive  . Father  Alive  . Sister  Alive  . Brother  Alive  . Mat Uncle  Deceased        Her family history includes Asthma in her maternal uncle; Cancer in her maternal uncle.      No Known Allergies  No current outpatient medications on file.   Patient Care Team: Rubye Beach as PCP - General (Family Medicine)    Objective:    Vitals: BP 105/68   Pulse (!) 107   Temp (!) 97.2 F (36.2 C) (Temporal)   Resp 18   Ht 5\' 2"  (1.575 m)   Wt 127 lb 6.4 oz  (57.8 kg)   LMP 10/22/2019 (Within Days)   BMI 23.30 kg/m    Vitals:   11/19/19 1601  BP: 105/68  Pulse: (!) 107  Resp: 18  Temp: (!) 97.2 F (36.2 C)  TempSrc: Temporal  Weight: 127 lb 6.4 oz (57.8 kg)  Height: 5\' 2"  (1.575 m)     Physical Exam Vitals reviewed.  Constitutional:      General: She is not in acute distress.    Appearance: Normal appearance. She is well-developed and normal weight. She is not ill-appearing or diaphoretic.  HENT:     Head: Normocephalic and atraumatic.     Right Ear: Tympanic membrane, ear canal and external ear normal. There is no impacted cerumen.     Left Ear: Tympanic membrane, ear canal and external ear normal. There is no impacted cerumen.     Nose: Nose normal.     Mouth/Throat:     Mouth: Mucous membranes are moist.     Pharynx: Oropharynx is clear. No oropharyngeal exudate.  Eyes:     General: No scleral icterus.       Right eye: No discharge.        Left eye: No discharge.     Extraocular Movements: Extraocular movements intact.     Conjunctiva/sclera: Conjunctivae normal.     Pupils: Pupils are equal, round, and reactive to light.  Neck:     Thyroid: No thyromegaly.     Vascular: No JVD.     Trachea: No tracheal deviation.  Cardiovascular:     Rate and Rhythm: Normal rate and regular rhythm.     Pulses: Normal pulses.     Heart sounds: Normal heart sounds. No murmur. No friction rub. No gallop.   Pulmonary:     Effort: Pulmonary effort is normal. No respiratory distress.     Breath sounds: Normal breath sounds. No wheezing or rales.  Chest:     Chest wall: No tenderness.     Breasts:        Right: Normal. No mass, skin change or tenderness.        Left: Normal. No mass, skin change or tenderness.  Abdominal:     General: Abdomen is flat. Bowel sounds are normal. There is no distension.     Palpations: Abdomen is soft. There is no mass.     Tenderness: There is no abdominal tenderness. There is no guarding or rebound.    Musculoskeletal:        General: No tenderness. Normal range of motion.     Cervical back: Normal range of motion and neck supple.     Right lower leg:  No edema.     Left lower leg: No edema.  Lymphadenopathy:     Cervical: No cervical adenopathy.     Upper Body:     Right upper body: Axillary adenopathy (small, tender node felt in right axilla, suspect reactive to recent thyroid biopsy done 2 weeks ago) present. No supraclavicular or pectoral adenopathy.     Left upper body: No supraclavicular, axillary or pectoral adenopathy.  Skin:    General: Skin is warm and dry.     Capillary Refill: Capillary refill takes less than 2 seconds.     Findings: No rash.  Neurological:     General: No focal deficit present.     Mental Status: She is alert and oriented to person, place, and time. Mental status is at baseline.  Psychiatric:        Mood and Affect: Mood normal.        Behavior: Behavior normal.        Thought Content: Thought content normal.        Judgment: Judgment normal.      Depression Screen PHQ 2/9 Scores 11/19/2019 11/12/2018 11/07/2017  PHQ - 2 Score 0 0 0  PHQ- 9 Score 0 0 0       Assessment & Plan:     Routine Health Maintenance and Physical Exam  Exercise Activities and Dietary recommendations Goals   None     Immunization History  Administered Date(s) Administered  . Influenza,inj,Quad PF,6+ Mos 11/03/2016  . Influenza-Unspecified 10/06/2015, 09/20/2017    Health Maintenance  Topic Date Due  . TETANUS/TDAP  05/07/2008  . PAP SMEAR-Modifier  11/02/2018  . INFLUENZA VACCINE  06/29/2019  . HIV Screening  Completed     Discussed health benefits of physical activity, and encouraged her to engage in regular exercise appropriate for her age and condition.    1. Annual physical exam Normal physical exam today. Will check labs as below and f/u pending lab results. If labs are stable and WNL she will not need to have these rechecked for one year at  her next annual physical exam. She is to call the office in the meantime if she has any acute issue, questions or concerns. - CBC w/Diff/Platelet - Comprehensive Metabolic Panel (CMET) - TSH - Lipid Profile - HgB A1c  2. Seborrhea capitis in adult Noted on exam. Patient request dermatology referral. Will try ketoconazole shampoo as below to lessen until seen by Derm.  - Ambulatory referral to Dermatology - KETOCONAZOLE, TOPICAL, 1 % SHAM; Apply topically twice weekly; rinse out  Dispense: 200 mL; Refill: 1  3. Axillary lymphadenopathy Noted on exam with tenderness. Suspect possibly reactive due to thyroid biopsy 2 weeks prior. Will get [place] as below. Advised may use warm compresses and NSAID of choice prn for discomfort.  - [place] AXILLA RIGHT; Future  4. Disorder of salivary gland Noted to have pain and tenderness in the tonsillar and submandibular glands. No swelling noted. Suspect possible TMJ referring pain vs salivary dysfunction. Will refer to ENT for further evaluation. Patient has seen a dentist and does have TMJ on the left. He had recommended surgery for her TMJ which she declined.  - Ambulatory referral to ENT  --------------------------------------------------------------------    Mar Daring, PA-C  [facility]

## 2019-11-19 NOTE — Patient Instructions (Signed)
Health Maintenance, Female Adopting a healthy lifestyle and getting preventive care are important in promoting health and wellness. Ask your health care provider about:  The right schedule for you to have regular tests and exams.  Things you can do on your own to prevent diseases and keep yourself healthy. What should I know about diet, weight, and exercise? Eat a healthy diet   Eat a diet that includes plenty of vegetables, fruits, low-fat dairy products, and lean protein.  Do not eat a lot of foods that are high in solid fats, added sugars, or sodium. Maintain a healthy weight Body mass index (BMI) is used to identify weight problems. It estimates body fat based on height and weight. Your health care provider can help determine your BMI and help you achieve or maintain a healthy weight. Get regular exercise Get regular exercise. This is one of the most important things you can do for your health. Most adults should:  Exercise for at least 150 minutes each week. The exercise should increase your heart rate and make you sweat (moderate-intensity exercise).  Do strengthening exercises at least twice a week. This is in addition to the moderate-intensity exercise.  Spend less time sitting. Even light physical activity can be beneficial. Watch cholesterol and blood lipids Have your blood tested for lipids and cholesterol at 30 years of age, then have this test every 5 years. Have your cholesterol levels checked more often if:  Your lipid or cholesterol levels are high.  You are older than 30 years of age.  You are at high risk for heart disease. What should I know about cancer screening? Depending on your health history and family history, you may need to have cancer screening at various ages. This may include screening for:  Breast cancer.  Cervical cancer.  Colorectal cancer.  Skin cancer.  Lung cancer. What should I know about heart disease, diabetes, and high blood  pressure? Blood pressure and heart disease  High blood pressure causes heart disease and increases the risk of stroke. This is more likely to develop in people who have high blood pressure readings, are of African descent, or are overweight.  Have your blood pressure checked: ? Every 3-5 years if you are 18-39 years of age. ? Every year if you are 40 years old or older. Diabetes Have regular diabetes screenings. This checks your fasting blood sugar level. Have the screening done:  Once every three years after age 40 if you are at a normal weight and have a low risk for diabetes.  More often and at a younger age if you are overweight or have a high risk for diabetes. What should I know about preventing infection? Hepatitis B If you have a higher risk for hepatitis B, you should be screened for this virus. Talk with your health care provider to find out if you are at risk for hepatitis B infection. Hepatitis C Testing is recommended for:  Everyone born from 1945 through 1965.  Anyone with known risk factors for hepatitis C. Sexually transmitted infections (STIs)  Get screened for STIs, including gonorrhea and chlamydia, if: ? You are sexually active and are younger than 30 years of age. ? You are older than 30 years of age and your health care provider tells you that you are at risk for this type of infection. ? Your sexual activity has changed since you were last screened, and you are at increased risk for chlamydia or gonorrhea. Ask your health care provider if   you are at risk.  Ask your health care provider about whether you are at high risk for HIV. Your health care provider may recommend a prescription medicine to help prevent HIV infection. If you choose to take medicine to prevent HIV, you should first get tested for HIV. You should then be tested every 3 months for as long as you are taking the medicine. Pregnancy  If you are about to stop having your period (premenopausal) and  you may become pregnant, seek counseling before you get pregnant.  Take 400 to 800 micrograms (mcg) of folic acid every day if you become pregnant.  Ask for birth control (contraception) if you want to prevent pregnancy. Osteoporosis and menopause Osteoporosis is a disease in which the bones lose minerals and strength with aging. This can result in bone fractures. If you are 65 years old or older, or if you are at risk for osteoporosis and fractures, ask your health care provider if you should:  Be screened for bone loss.  Take a calcium or vitamin D supplement to lower your risk of fractures.  Be given hormone replacement therapy (HRT) to treat symptoms of menopause. Follow these instructions at home: Lifestyle  Do not use any products that contain nicotine or tobacco, such as cigarettes, e-cigarettes, and chewing tobacco. If you need help quitting, ask your health care provider.  Do not use street drugs.  Do not share needles.  Ask your health care provider for help if you need support or information about quitting drugs. Alcohol use  Do not drink alcohol if: ? Your health care provider tells you not to drink. ? You are pregnant, may be pregnant, or are planning to become pregnant.  If you drink alcohol: ? Limit how much you use to 0-1 drink a day. ? Limit intake if you are breastfeeding.  Be aware of how much alcohol is in your drink. In the [place] one drink equals one 12 oz bottle of beer (355 mL), one 5 oz glass of wine (148 mL), or one 1 oz glass of hard liquor (44 mL). General instructions  Schedule regular health, dental, and eye exams.  Stay current with your vaccines.  Tell your health care provider if: ? You often feel depressed. ? You have ever been abused or do not feel safe at home. Summary  Adopting a healthy lifestyle and getting preventive care are important in promoting health and wellness.  Follow your health care provider's instructions about healthy  diet, exercising, and getting tested or screened for diseases.  Follow your health care provider's instructions on monitoring your cholesterol and blood pressure. This information is not intended to replace advice given to you by your health care provider. Make sure you discuss any questions you have with your health care provider. Document Released: 05/30/2011 Document Revised: 11/07/2018 Document Reviewed: 11/07/2018 Elsevier Patient Education  2020 [employer]vier Inc[employer]  

## 2019-11-27 ENCOUNTER — Telehealth: Payer: Self-pay

## 2019-11-27 DIAGNOSIS — N6320 Unspecified lump in the left breast, unspecified quadrant: Secondary | ICD-10-CM

## 2019-11-27 DIAGNOSIS — R590 Localized enlarged lymph nodes: Secondary | ICD-10-CM

## 2019-11-27 DIAGNOSIS — Z1231 Encounter for screening mammogram for malignant neoplasm of breast: Secondary | ICD-10-CM

## 2019-11-27 NOTE — Addendum Note (Signed)
Addended by: Shawna Orleans on: 11/27/2019 04:25 PM   Modules accepted: Orders

## 2019-11-27 NOTE — Addendum Note (Signed)
Addended by: Mar Daring on: 11/27/2019 04:35 PM   Modules accepted: Orders

## 2019-11-27 NOTE — Telephone Encounter (Signed)
Orders signed.

## 2019-11-27 NOTE — Telephone Encounter (Signed)
Copied from [place] [id number]. Topic: General - Other >> Nov 27, 2019  9:56 AM Parke Poisson wrote: Reason for CRM: Per [facility] they will need an order for diagnostic bilateral mammogram TOMO [id number] ,left breast limited ultrasound [id number],right breast limited ultrasound [id number]

## 2019-11-28 LAB — CBC WITH DIFFERENTIAL/PLATELET
Basophils Absolute: 0.1 10*3/uL (ref 0.0–0.2)
Basos: 1 %
EOS (ABSOLUTE): 0.2 10*3/uL (ref 0.0–0.4)
Eos: 3 %
Hematocrit: 37.5 % (ref 34.0–46.6)
Hemoglobin: 12.3 g/dL (ref 11.1–15.9)
Immature Grans (Abs): 0 10*3/uL (ref 0.0–0.1)
Immature Granulocytes: 0 %
Lymphocytes Absolute: 2.4 10*3/uL (ref 0.7–3.1)
Lymphs: 37 %
MCH: 28.5 pg (ref 26.6–33.0)
MCHC: 32.8 g/dL (ref 31.5–35.7)
MCV: 87 fL (ref 79–97)
Monocytes Absolute: 0.4 10*3/uL (ref 0.1–0.9)
Monocytes: 7 %
Neutrophils Absolute: 3.3 10*3/uL (ref 1.4–7.0)
Neutrophils: 52 %
Platelets: 281 10*3/uL (ref 150–450)
RBC: 4.32 x10E6/uL (ref 3.77–5.28)
RDW: 12.4 % (ref 11.7–15.4)
WBC: 6.4 10*3/uL (ref 3.4–10.8)

## 2019-11-28 LAB — HEMOGLOBIN A1C
Est. average glucose Bld gHb Est-mCnc: 100 mg/dL
Hgb A1c MFr Bld: 5.1 % (ref 4.8–5.6)

## 2019-11-28 LAB — COMPREHENSIVE METABOLIC PANEL
ALT: 47 IU/L — ABNORMAL HIGH (ref 0–32)
AST: 35 IU/L (ref 0–40)
Albumin/Globulin Ratio: 1.8 (ref 1.2–2.2)
Albumin: 4.3 g/dL (ref 3.9–5.0)
Alkaline Phosphatase: 76 IU/L (ref 39–117)
BUN/Creatinine Ratio: 13 (ref 9–23)
BUN: 9 mg/dL (ref 6–20)
Bilirubin Total: 0.2 mg/dL (ref 0.0–1.2)
CO2: 21 mmol/L (ref 20–29)
Calcium: 9 mg/dL (ref 8.7–10.2)
Chloride: 105 mmol/L (ref 96–106)
Creatinine, Ser: 0.72 mg/dL (ref 0.57–1.00)
GFR calc Af Amer: 130 mL/min/{1.73_m2} (ref >59)
GFR calc non Af Amer: 113 mL/min/{1.73_m2} (ref >59)
Globulin, Total: 2.4 g/dL (ref 1.5–4.5)
Glucose: 82 mg/dL (ref 65–99)
Potassium: 4.1 mmol/L (ref 3.5–5.2)
Sodium: 140 mmol/L (ref 134–144)
Total Protein: 6.7 g/dL (ref 6.0–8.5)

## 2019-11-28 LAB — LIPID PANEL
Chol/HDL Ratio: 3.8 ratio (ref 0.0–4.4)
Cholesterol, Total: 214 mg/dL — ABNORMAL HIGH (ref 100–199)
HDL: 57 mg/dL (ref >39)
LDL Chol Calc (NIH): 148 mg/dL — ABNORMAL HIGH (ref 0–99)
Triglycerides: 52 mg/dL (ref 0–149)
VLDL Cholesterol Cal: 9 mg/dL (ref 5–40)

## 2019-11-28 LAB — TSH: TSH: 0.916 u[IU]/mL (ref 0.450–4.500)

## 2019-11-29 NOTE — L&D Delivery Note (Signed)
Delivery Note At 3:38 PM a viable and healthy female was delivered via Vaginal, Spontaneous (Presentation:   Occiput Anterior).  APGAR: 9,9 ; weight  pending   Placenta status: Spontaneous, Intact.  Cord: 3 vessels with the following complications: None.  Cord pH: NA  Anesthesia: Epidural Episiotomy: None Lacerations: 2nd degree;Perineal Suture Repair: 3.0 vicryl rapide Est. Blood Loss (mL):  300  Mom to postpartum.  Baby to Couplet care / Skin to Skin.  [name] 09/26/2020, 3:59 PM

## 2019-12-09 ENCOUNTER — Ambulatory Visit
Admission: RE | Admit: 2019-12-09 | Discharge: 2019-12-09 | Disposition: A | Discharge status: Home / Self Care | Payer: 59 | Source: Ambulatory Visit | Attending: Physician Assistant | Admitting: Physician Assistant

## 2019-12-09 DIAGNOSIS — Z1231 Encounter for screening mammogram for malignant neoplasm of breast: Secondary | ICD-10-CM

## 2019-12-09 DIAGNOSIS — N6320 Unspecified lump in the left breast, unspecified quadrant: Secondary | ICD-10-CM

## 2019-12-09 DIAGNOSIS — R590 Localized enlarged lymph nodes: Secondary | ICD-10-CM | POA: Insufficient documentation

## 2020-01-21 ENCOUNTER — Encounter: Payer: Self-pay | Admitting: Physician Assistant

## 2020-01-21 ENCOUNTER — Other Ambulatory Visit: Payer: Self-pay

## 2020-01-21 ENCOUNTER — Ambulatory Visit (INDEPENDENT_AMBULATORY_CARE_PROVIDER_SITE_OTHER): Payer: 59 | Admitting: Physician Assistant

## 2020-01-21 VITALS — BP 102/71 | HR 112 | Temp 96.9°F | Resp 16 | Wt 127.6 lb

## 2020-01-21 DIAGNOSIS — N898 Other specified noninflammatory disorders of vagina: Secondary | ICD-10-CM

## 2020-01-21 DIAGNOSIS — Z124 Encounter for screening for malignant neoplasm of cervix: Secondary | ICD-10-CM | POA: Diagnosis not present

## 2020-01-21 DIAGNOSIS — R300 Dysuria: Secondary | ICD-10-CM

## 2020-01-21 LAB — POCT URINALYSIS DIPSTICK
Bilirubin, UA: NEGATIVE
Blood, UA: NEGATIVE
Glucose, UA: NEGATIVE
Ketones, UA: NEGATIVE
Nitrite, UA: NEGATIVE
Protein, UA: NEGATIVE
Spec Grav, UA: 1.025 (ref 1.010–1.025)
Urobilinogen, UA: 0.2 E.U./dL
pH, UA: 5 (ref 5.0–8.0)

## 2020-01-21 NOTE — Patient Instructions (Signed)

## 2020-01-21 NOTE — Progress Notes (Signed)
Patient: Robin Griffin Female    DOB: Mar 19, 1991   29 y.o.   MRN: [id number] Visit Date: 01/21/2020  Today's Provider: Trinna Post, PA-C   Chief Complaint  Patient presents with  . Urinary Tract Infection   Subjective:    I Sulibeya S. Dimas, CMA, am acting as scribe for [employer], PA-C.   HPI Urinary Tract Infection: Patient complains of burning with urination, dysuria, frequency and suprapubic pressure She has had symptoms for 2 week. Patient also complains of itching. Patient denies back pain, fever, stomach ache and vaginal discharge. Patient does not have a history of recurrent UTI.  Patient does not have a history of pyelonephritis. Reports some burning and also itching.   No Known Allergies   Current Outpatient Medications:  .  KETOCONAZOLE, TOPICAL, 1 % SHAM, Apply topically twice weekly; rinse out, Disp: 200 mL, Rfl: 1  Review of Systems  Constitutional: Negative for chills and fever.  Respiratory: Negative for cough and shortness of breath.   Cardiovascular: Negative for chest pain and palpitations.  Genitourinary: Positive for dysuria, frequency and urgency. Negative for flank pain, hematuria, pelvic pain, vaginal bleeding and vaginal discharge.    Social History   Tobacco Use  . Smoking status: Never Smoker  . Smokeless tobacco: Never Used  Substance Use Topics  . Alcohol use: No      Objective:   BP 102/71 (BP Location: Left Arm, Patient Position: Sitting, Cuff Size: Normal)   Pulse (!) 112   Temp (!) 96.9 F (36.1 C) (Temporal)   Resp 16   Wt 127 lb 9.6 oz (57.9 kg)   LMP 12/23/2019 (Exact Date)   BMI 23.34 kg/m  Vitals:   01/21/20 1134  BP: 102/71  Pulse: (!) 112  Resp: 16  Temp: (!) 96.9 F (36.1 C)  TempSrc: Temporal  Weight: 127 lb 9.6 oz (57.9 kg)  Body mass index is 23.34 kg/m.   Physical Exam Exam conducted with a chaperone present.  Constitutional:      Appearance: Normal appearance.  Cardiovascular:   Rate and Rhythm: Normal rate.  Pulmonary:     Effort: Pulmonary effort is normal.  Genitourinary:    General: Normal vulva.     Vagina: Vaginal discharge present.     Cervix: Normal.     Comments: Some homogenous white discharge in vaginal vault.  Skin:    General: Skin is warm and dry.  Neurological:     Mental Status: She is alert.  Psychiatric:        Mood and Affect: Mood normal.        Behavior: Behavior normal.      Results for orders placed or performed in visit on 01/21/20  POCT urinalysis dipstick  Result Value Ref Range   Color, UA yellow    Clarity, UA clear    Glucose, UA Negative Negative   Bilirubin, UA Negative    Ketones, UA Negative    Spec Grav, UA 1.025 1.010 - 1.025   Blood, UA Negative    pH, UA 5.0 5.0 - 8.0   Protein, UA Negative Negative   Urobilinogen, UA 0.2 0.2 or 1.0 E.U./dL   Nitrite, UA Negative    Leukocytes, UA Large (3+) (A) Negative       Assessment & Plan    1. Dysuria  Urine dipstick with some leukocytes but otherwise negative. Will send for Cx. Also collected vaginal swab as these symptoms may represent vaginitis. We have decided  to wait until the culture and swab return before starting treatment. She can contact the clinic if there is interim worsening.   - POCT urinalysis dipstick - Urine Culture  2. Cervical cancer screening  She was due for a PAP smear so we collected this during the exam. This will be sent through [place] per insurance.   - Pap IG and HPV (high risk) DNA detection  3. Vaginal discharge  - NuSwab Vaginitis Plus (VG+)      Trinna Post, PA-C  [facility]

## 2020-01-21 NOTE — Addendum Note (Signed)
Addended by: Trinna Post on: 01/21/2020 03:30 PM   Modules accepted: Orders

## 2020-01-23 ENCOUNTER — Telehealth: Payer: Self-pay

## 2020-01-23 ENCOUNTER — Other Ambulatory Visit: Payer: Self-pay | Admitting: Physician Assistant

## 2020-01-23 DIAGNOSIS — N3090 Cystitis, unspecified without hematuria: Secondary | ICD-10-CM

## 2020-01-23 LAB — URINE CULTURE

## 2020-01-23 MED ORDER — SULFAMETHOXAZOLE-TRIMETHOPRIM 800-160 MG PO TABS
1.0000 | ORAL_TABLET | Freq: Two times a day (BID) | ORAL | 0 refills | Status: AC
Start: 2020-01-23 — End: 2020-01-26

## 2020-01-23 NOTE — Telephone Encounter (Signed)
Patient was advised and states that she will go to [facility] to pick up medication.

## 2020-01-23 NOTE — Telephone Encounter (Signed)
-----   Message from Trinna Post, [place] sent at 01/23/2020 12:21 PM EST ----- Please let patient know that urine culture showed E. Coli infection and I have sent in bactrim according to the culture.

## 2020-01-24 LAB — NUSWAB VAGINITIS PLUS (VG+)
Candida albicans, NAA: NEGATIVE
Candida glabrata, NAA: NEGATIVE
Chlamydia trachomatis, NAA: NEGATIVE
Neisseria gonorrhoeae, NAA: NEGATIVE
Trich vag by NAA: NEGATIVE

## 2020-01-24 LAB — IGP, APTIMA HPV, RFX 16/18,45
HPV Aptima: NEGATIVE
PAP Smear Comment: 0

## 2020-03-09 LAB — OB RESULTS CONSOLE RPR: RPR: NONREACTIVE

## 2020-03-09 LAB — OB RESULTS CONSOLE HEPATITIS B SURFACE ANTIGEN: Hepatitis B Surface Ag: NEGATIVE

## 2020-03-09 LAB — OB RESULTS CONSOLE ANTIBODY SCREEN: Antibody Screen: NEGATIVE

## 2020-03-09 LAB — OB RESULTS CONSOLE ABO/RH: RH Type: POSITIVE

## 2020-03-09 LAB — OB RESULTS CONSOLE GC/CHLAMYDIA
Chlamydia: NEGATIVE
Gonorrhea: NEGATIVE

## 2020-03-09 LAB — OB RESULTS CONSOLE HIV ANTIBODY (ROUTINE TESTING): HIV: NONREACTIVE

## 2020-03-09 LAB — OB RESULTS CONSOLE RUBELLA ANTIBODY, IGM: Rubella: IMMUNE

## 2020-03-13 ENCOUNTER — Ambulatory Visit: Payer: 59 | Attending: Internal Medicine

## 2020-03-13 DIAGNOSIS — Z23 Encounter for immunization: Secondary | ICD-10-CM

## 2020-03-13 NOTE — Progress Notes (Signed)
   Covid-19 Vaccination Clinic  Name:  Robin Griffin    MRN: [id number] DOB: August 26, 1991  03/13/2020  Robin Griffin was observed post Covid-19 immunization for 15 minutes without incident. She was provided with Vaccine Information Sheet and instruction to access the V-Safe system.   Robin Griffin was instructed to call 911 with any severe reactions post vaccine: Marland Kitchen Difficulty breathing  . Swelling of face and throat  . A fast heartbeat  . A bad rash all over body  . Dizziness and weakness   Immunizations Administered    Name Date Dose VIS Date Route   Pfizer COVID-19 Vaccine 03/13/2020 11:10 AM 0.3 mL 11/08/2019 Intramuscular   Manufacturer: [employer], [employer]   Lot: [id number]   [place]: [id number]

## 2020-04-04 ENCOUNTER — Ambulatory Visit: Payer: 59 | Attending: Internal Medicine

## 2020-04-04 DIAGNOSIS — Z23 Encounter for immunization: Secondary | ICD-10-CM

## 2020-04-04 NOTE — Progress Notes (Signed)
   Covid-19 Vaccination Clinic  Name:  Robin Griffin    MRN: [id number] DOB: 1991-09-03  04/04/2020  [name] was observed post Covid-19 immunization for 15 minutes without incident. She was provided with Vaccine Information Sheet and instruction to access the V-Safe system.   [name] was instructed to call 911 with any severe reactions post vaccine: Marland Kitchen Difficulty breathing  . Swelling of face and throat  . A fast heartbeat  . A bad rash all over body  . Dizziness and weakness   Immunizations Administered    Name Date Dose VIS Date Route   Pfizer COVID-19 Vaccine 04/04/2020  2:02 PM 0.3 mL 01/22/2019 Intramuscular   Manufacturer: [place]   Lot: [id number]   [place]: [id number]

## 2020-09-04 LAB — OB RESULTS CONSOLE GBS: GBS: NEGATIVE

## 2020-09-21 ENCOUNTER — Telehealth (HOSPITAL_COMMUNITY): Payer: Self-pay | Admitting: *Deleted

## 2020-09-21 ENCOUNTER — Encounter (HOSPITAL_COMMUNITY): Payer: Self-pay | Admitting: *Deleted

## 2020-09-21 NOTE — Telephone Encounter (Signed)
Preadmission screen  

## 2020-09-22 ENCOUNTER — Other Ambulatory Visit: Payer: Self-pay | Admitting: Obstetrics & Gynecology

## 2020-09-24 ENCOUNTER — Other Ambulatory Visit (HOSPITAL_COMMUNITY)
Admission: RE | Admit: 2020-09-24 | Discharge: 2020-09-24 | Disposition: A | Discharge status: Home / Self Care | Payer: 59 | Source: Ambulatory Visit | Attending: Obstetrics & Gynecology | Admitting: Obstetrics & Gynecology

## 2020-09-24 DIAGNOSIS — Z01812 Encounter for preprocedural laboratory examination: Secondary | ICD-10-CM | POA: Insufficient documentation

## 2020-09-24 DIAGNOSIS — Z20822 Contact with and (suspected) exposure to covid-19: Secondary | ICD-10-CM | POA: Insufficient documentation

## 2020-09-24 LAB — SARS CORONAVIRUS 2 (TAT 6-24 HRS): SARS Coronavirus 2: NEGATIVE

## 2020-09-26 ENCOUNTER — Inpatient Hospital Stay (HOSPITAL_COMMUNITY): Payer: 59

## 2020-09-26 ENCOUNTER — Inpatient Hospital Stay (HOSPITAL_COMMUNITY)
Admission: AD | Admit: 2020-09-26 | Discharge: 2020-09-28 | DRG: 807 | Disposition: A | Discharge status: Home / Self Care | Payer: 59 | Attending: Obstetrics & Gynecology | Admitting: Obstetrics & Gynecology

## 2020-09-26 ENCOUNTER — Encounter (HOSPITAL_COMMUNITY): Payer: Self-pay | Admitting: Obstetrics & Gynecology

## 2020-09-26 ENCOUNTER — Inpatient Hospital Stay (HOSPITAL_COMMUNITY): Payer: 59 | Admitting: Anesthesiology

## 2020-09-26 ENCOUNTER — Other Ambulatory Visit: Payer: Self-pay

## 2020-09-26 DIAGNOSIS — O26893 Other specified pregnancy related conditions, third trimester: Secondary | ICD-10-CM | POA: Diagnosis present

## 2020-09-26 DIAGNOSIS — Z3490 Encounter for supervision of normal pregnancy, unspecified, unspecified trimester: Secondary | ICD-10-CM | POA: Diagnosis present

## 2020-09-26 DIAGNOSIS — E039 Hypothyroidism, unspecified: Secondary | ICD-10-CM | POA: Diagnosis present

## 2020-09-26 DIAGNOSIS — Z3A39 39 weeks gestation of pregnancy: Secondary | ICD-10-CM

## 2020-09-26 DIAGNOSIS — Z20822 Contact with and (suspected) exposure to covid-19: Secondary | ICD-10-CM | POA: Diagnosis present

## 2020-09-26 DIAGNOSIS — O99284 Endocrine, nutritional and metabolic diseases complicating childbirth: Secondary | ICD-10-CM | POA: Diagnosis present

## 2020-09-26 HISTORY — DX: Hypothyroidism, unspecified: E03.9

## 2020-09-26 LAB — CBC
HCT: 39.6 % (ref 36.0–46.0)
Hemoglobin: 13 g/dL (ref 12.0–15.0)
MCH: 29.7 pg (ref 26.0–34.0)
MCHC: 32.8 g/dL (ref 30.0–36.0)
MCV: 90.4 fL (ref 80.0–100.0)
Platelets: 225 10*3/uL (ref 150–400)
RBC: 4.38 MIL/uL (ref 3.87–5.11)
RDW: 13.3 % (ref 11.5–15.5)
WBC: 10.6 10*3/uL — ABNORMAL HIGH (ref 4.0–10.5)
nRBC: 0 % (ref 0.0–0.2)

## 2020-09-26 LAB — TYPE AND SCREEN
ABO/RH(D): O POS
Antibody Screen: NEGATIVE

## 2020-09-26 MED ORDER — OXYTOCIN-SODIUM CHLORIDE 30-0.9 UT/500ML-% IV SOLN
2.5000 [IU]/h | INTRAVENOUS | Status: DC
  Administered 2020-09-26: 2.5 [IU]/h via INTRAVENOUS

## 2020-09-26 MED ORDER — DIPHENHYDRAMINE HCL 25 MG PO CAPS: 25.0000 mg | ORAL_CAPSULE | Freq: Four times a day (QID) | ORAL | Status: DC | PRN

## 2020-09-26 MED ORDER — FENTANYL-BUPIVACAINE-NACL 0.5-0.125-0.9 MG/250ML-% EP SOLN
12.0000 mL/h | EPIDURAL | Status: DC | PRN
  Filled 2020-09-26: qty 250

## 2020-09-26 MED ORDER — LACTATED RINGERS IV SOLN: 500.0000 mL | INTRAVENOUS | Status: DC | PRN

## 2020-09-26 MED ORDER — PHENYLEPHRINE 40 MCG/ML (10ML) SYRINGE FOR IV PUSH (FOR BLOOD PRESSURE SUPPORT): 80.0000 ug | PREFILLED_SYRINGE | INTRAVENOUS | Status: DC | PRN

## 2020-09-26 MED ORDER — OXYTOCIN BOLUS FROM INFUSION
333.0000 mL | Freq: Once | INTRAVENOUS | Status: AC
  Administered 2020-09-26: 333 mL via INTRAVENOUS

## 2020-09-26 MED ORDER — BENZOCAINE-MENTHOL 20-0.5 % EX AERO
1.0000 "application " | INHALATION_SPRAY | CUTANEOUS | Status: DC | PRN
  Administered 2020-09-28: 1 via TOPICAL
  Filled 2020-09-26 (×2): qty 56

## 2020-09-26 MED ORDER — SENNOSIDES-DOCUSATE SODIUM 8.6-50 MG PO TABS
2.0000 | ORAL_TABLET | ORAL | Status: DC
  Administered 2020-09-27 – 2020-09-28 (×2): 2 via ORAL
  Filled 2020-09-26 (×2): qty 2

## 2020-09-26 MED ORDER — ACETAMINOPHEN 325 MG PO TABS: 650.0000 mg | ORAL_TABLET | ORAL | Status: DC | PRN

## 2020-09-26 MED ORDER — COCONUT OIL OIL
1.0000 "application " | TOPICAL_OIL | Status: DC | PRN
  Administered 2020-09-27: 1 via TOPICAL

## 2020-09-26 MED ORDER — ACETAMINOPHEN 325 MG PO TABS
650.0000 mg | ORAL_TABLET | ORAL | Status: DC | PRN
  Administered 2020-09-26: 650 mg via ORAL
  Filled 2020-09-26: qty 2

## 2020-09-26 MED ORDER — ONDANSETRON HCL 4 MG/2ML IJ SOLN: 4.0000 mg | Freq: Four times a day (QID) | INTRAMUSCULAR | Status: DC | PRN

## 2020-09-26 MED ORDER — LIDOCAINE HCL (PF) 1 % IJ SOLN
INTRAMUSCULAR | Status: DC | PRN
  Administered 2020-09-26: 8 mL via EPIDURAL

## 2020-09-26 MED ORDER — LACTATED RINGERS IV SOLN: INTRAVENOUS | Status: DC

## 2020-09-26 MED ORDER — LIDOCAINE HCL (PF) 1 % IJ SOLN: 30.0000 mL | INTRAMUSCULAR | Status: DC | PRN

## 2020-09-26 MED ORDER — SOD CITRATE-CITRIC ACID 500-334 MG/5ML PO SOLN: 30.0000 mL | ORAL | Status: DC | PRN

## 2020-09-26 MED ORDER — ONDANSETRON HCL 4 MG PO TABS: 4.0000 mg | ORAL_TABLET | ORAL | Status: DC | PRN

## 2020-09-26 MED ORDER — EPHEDRINE 5 MG/ML INJ: 10.0000 mg | INTRAVENOUS | Status: DC | PRN

## 2020-09-26 MED ORDER — LACTATED RINGERS IV SOLN: 500.0000 mL | Freq: Once | INTRAVENOUS | Status: DC

## 2020-09-26 MED ORDER — WITCH HAZEL-GLYCERIN EX PADS: 1.0000 "application " | MEDICATED_PAD | CUTANEOUS | Status: DC | PRN

## 2020-09-26 MED ORDER — TERBUTALINE SULFATE 1 MG/ML IJ SOLN: 0.2500 mg | Freq: Once | INTRAMUSCULAR | Status: DC | PRN

## 2020-09-26 MED ORDER — SIMETHICONE 80 MG PO CHEW: 80.0000 mg | CHEWABLE_TABLET | ORAL | Status: DC | PRN

## 2020-09-26 MED ORDER — DIBUCAINE (PERIANAL) 1 % EX OINT: 1.0000 "application " | TOPICAL_OINTMENT | CUTANEOUS | Status: DC | PRN

## 2020-09-26 MED ORDER — BUPIVACAINE HCL (PF) 0.75 % IJ SOLN
INTRAMUSCULAR | Status: DC | PRN
Start: 2020-09-26 — End: 2020-09-26
  Administered 2020-09-26: 12 mL/h via EPIDURAL

## 2020-09-26 MED ORDER — DIPHENHYDRAMINE HCL 50 MG/ML IJ SOLN: 12.5000 mg | INTRAMUSCULAR | Status: DC | PRN

## 2020-09-26 MED ORDER — ONDANSETRON HCL 4 MG/2ML IJ SOLN: 4.0000 mg | INTRAMUSCULAR | Status: DC | PRN

## 2020-09-26 MED ORDER — PRENATAL MULTIVITAMIN CH
1.0000 | ORAL_TABLET | Freq: Every day | ORAL | Status: DC
  Administered 2020-09-27 – 2020-09-28 (×2): 1 via ORAL
  Filled 2020-09-26 (×2): qty 1

## 2020-09-26 MED ORDER — IBUPROFEN 600 MG PO TABS
600.0000 mg | ORAL_TABLET | Freq: Four times a day (QID) | ORAL | Status: DC
  Administered 2020-09-26 – 2020-09-28 (×8): 600 mg via ORAL
  Filled 2020-09-26 (×8): qty 1

## 2020-09-26 MED ORDER — TETANUS-DIPHTH-ACELL PERTUSSIS 5-2.5-18.5 LF-MCG/0.5 IM SUSY: 0.5000 mL | PREFILLED_SYRINGE | Freq: Once | INTRAMUSCULAR | Status: DC

## 2020-09-26 MED ORDER — OXYTOCIN-SODIUM CHLORIDE 30-0.9 UT/500ML-% IV SOLN
1.0000 m[IU]/min | INTRAVENOUS | Status: DC
  Administered 2020-09-26: 2 m[IU]/min via INTRAVENOUS
  Filled 2020-09-26: qty 500

## 2020-09-26 NOTE — H&P (Signed)
Robin Griffin is a 29 y.o. female presenting for IOL since term and favorable cervix. +FMs, + UCs, No LOF or VB PNcare from 8 wks, dating by 1st trim sono = LMP Synthroid (low dose at 25 mcg) for subclinical hypothyroidism with a thyroid nodule, labs nl ea trimester AGa growth 36 wks- 6'6" 57%, AC 72%, Vx, AFI nl G1- term SVD at 37 wks 6'10"  OB History    Gravida  2   Para  1   Term  1   Preterm      AB      Living  1     SAB      TAB      Ectopic      Multiple  0   Live Births  1          Past Medical History:  Diagnosis Date  . Dermoid cyst of left ovary 08/16/2016  . Hypothyroidism   . Seizures ([place])    childhood with fever   Past Surgical History:  Procedure Laterality Date  . LAPAROSCOPIC OVARIAN CYSTECTOMY Left 08/16/2016   Procedure: LAPAROSCOPIC OVARIAN CYSTECTOMY;  Surgeon: Robin Fallen, MD;  Location: [facility];  Service: Gynecology;  Laterality: Left;   Family History: family history includes Asthma in her maternal uncle; Cancer in her maternal uncle. Social History:  reports that she has never smoked. She has never used smokeless tobacco. She reports that she does not drink alcohol and does not use drugs.     Maternal Diabetes: No Genetic Screening: Normal  Ultrascreen Maternal Ultrasounds/Referrals: Normal Fetal Ultrasounds or other Referrals:  None Maternal Substance Abuse:  No Significant Maternal Medications:  Meds include: Syntroid Significant Maternal Lab Results:  Group B Strep negative Other Comments:  None  Review of Systems History Dilation: 5 Effacement (%): 60 Station: -3 Exam by:: J.Cox, RN Blood pressure 97/74, pulse (!) 122, temperature 99 F (37.2 C), temperature source Oral, height 5\' 2"  (1.575 m), weight 72.9 kg, last menstrual period 12/23/2019. Exam Physical Exam  Physical exam:  A&O x 3, no acute distress. Pleasant HEENT neg, no thyromegaly Lungs CTA bilat CV RRR, S1S2 normal Abdo soft, non tender, non  acute Extr no edema/ tenderness Pelvic per RN FHT  130s + accels no decels mod variab- cat I Toco regular q 3 min  Prenatal labs: ABO, Rh: --/--/O POS (10/30 1115) Antibody: NEG (10/30 1115) Rubella: Immune (04/12 0000) RPR: Nonreactive (04/12 0000)  HBsAg: Negative (04/12 0000)  HIV: Non-reactive (04/12 0000)  GBS: Negative/-- (10/08 0000)   Assessment/Plan: 29 yo, G2P1001, 39.2 wks, here for IOL but is in early labor at 5 cm dilation.  GBS(-).  AROM, pitocin as needed, epidural per pt. Anticipate SVD 7. Lbs   Robin Griffin 09/26/2020, 12:21 PM

## 2020-09-26 NOTE — Progress Notes (Signed)
Patient ID: Robin Griffin, female   DOB: 1991-09-19, 29 y.o.   MRN: [id number] Subjective: Doing well, wants epidural eventually   Objective: BP 114/71   Pulse 99   Temp 99 F (37.2 C) (Oral)   Ht 5\' 2"  (1.575 m)   Wt 72.9 kg   LMP 12/23/2019 (Exact Date)   BMI 29.39 kg/m   FHT:  FHR: 150 bpm, variability: moderate,  accelerations:  Present,  decelerations:  Absent UC:   regular, every 2-3 minutes SVE:   Dilation: 7 Effacement (%): 100 Station: -1 Exam by:: Robin Griffin AROM clear fluid   Assessment / Plan: Augmentation of labor, progressing well .Was scheduled for IOL but noted to be in early labor  GBS(-) Fetal Wellbeing:  Category I Pain Control:  Epidural  Anticipated MOD:  NSVD  [name] 09/26/2020, 1:38 PM

## 2020-09-26 NOTE — Progress Notes (Signed)
After ambulating back from restroom, patient complains of increased pain and burning in perineum. Assessed perineum. No obvious hematoma or increased bleeding. Ice applied and tylenol given upon patient request. Patient requests to try acetaminophen before taking anything stronger due to breast feeding.   Will continue to monitor.   Talbot Grumbling, RN

## 2020-09-26 NOTE — Anesthesia Preprocedure Evaluation (Signed)
Anesthesia Evaluation  Patient identified by MRN, date of birth, ID band Patient awake    Reviewed: Allergy & Precautions, NPO status , Patient's Chart, lab work & pertinent test results  Airway Mallampati: II  TM Distance: >3 FB Neck ROM: Full    Dental no notable dental hx.    Pulmonary neg pulmonary ROS,    Pulmonary exam normal breath sounds clear to auscultation       Cardiovascular negative cardio ROS Normal cardiovascular exam Rhythm:Regular Rate:Normal     Neuro/Psych negative neurological ROS  negative psych ROS   GI/Hepatic negative GI ROS, Neg liver ROS,   Endo/Other  Hypothyroidism   Renal/GU negative Renal ROS     Musculoskeletal negative musculoskeletal ROS (+)   Abdominal   Peds negative pediatric ROS (+)  Hematology negative hematology ROS (+)   Anesthesia Other Findings   Reproductive/Obstetrics negative OB ROS                             Anesthesia Physical Anesthesia Plan  ASA: II  Anesthesia Plan: Epidural   Post-op Pain Management:    Induction:   PONV Risk Score and Plan: 2 and Treatment may vary due to age or medical condition  Airway Management Planned: Natural Airway  Additional Equipment:   Intra-op Plan:   Post-operative Plan:   Informed Consent: I have reviewed the patients History and Physical, chart, labs and discussed the procedure including the risks, benefits and alternatives for the proposed anesthesia with the patient or authorized representative who has indicated his/her understanding and acceptance.       Plan Discussed with: Anesthesiologist  Anesthesia Plan Comments:         Anesthesia Quick Evaluation

## 2020-09-26 NOTE — Anesthesia Procedure Notes (Signed)
Epidural Patient location during procedure: OB Start time: 09/26/2020 1:52 PM End time: 09/26/2020 2:00 PM  Staffing Anesthesiologist: Merlinda Frederick, MD Performed: anesthesiologist   Preanesthetic Checklist Completed: patient identified, IV checked, site marked, risks and benefits discussed, monitors and equipment checked, pre-op evaluation and timeout performed  Epidural Patient position: sitting Prep: DuraPrep Patient monitoring: heart rate, cardiac monitor, continuous pulse ox and blood pressure Approach: midline Location: L2-L3 Injection technique: LOR saline  Needle:  Needle type: Tuohy  Needle gauge: 17 G Needle length: 9 cm Needle insertion depth: 4.5 cm Catheter size: 20 Guage Catheter at skin depth: 9.5 cm Test dose: negative and Other  Assessment Events: blood not aspirated, injection not painful, no injection resistance and negative IV test  Additional Notes Informed consent obtained prior to proceeding including risk of failure, 1% risk of PDPH, risk of minor discomfort and bruising.  Discussed rare but serious complications including epidural abscess, permanent nerve injury, epidural hematoma.  Discussed alternatives to epidural analgesia and patient desires to proceed.  Timeout performed pre-procedure verifying patient name, procedure, and platelet count.  Patient tolerated procedure well.

## 2020-09-27 LAB — RPR: RPR Ser Ql: NONREACTIVE

## 2020-09-27 NOTE — Anesthesia Postprocedure Evaluation (Signed)
Anesthesia Post Note  Patient: Robin Griffin  Procedure(s) Performed: AN AD [place]     Patient location during evaluation: Mother Baby Anesthesia Type: Epidural Level of consciousness: awake and alert, oriented and patient cooperative Pain management: pain level controlled Vital Signs Assessment: post-procedure vital signs reviewed and stable Respiratory status: spontaneous breathing Cardiovascular status: stable Postop Assessment: no headache, epidural receding, patient able to bend at knees and no signs of nausea or vomiting Anesthetic complications: no Comments: Pt. States she is walking.  Pain score 5.  Pain management adequate per patient.    No complications documented.  Last Vitals:  Vitals:   09/26/20 2345 09/27/20 0415  BP: 103/70 102/69  Pulse: 93 92  Resp: 18 17  Temp: 36.9 C 37.1 C  SpO2: 98% 100%    Last Pain:  Vitals:   09/27/20 0415  TempSrc: Oral  PainSc:    Pain Goal:                   [facility]

## 2020-09-27 NOTE — Progress Notes (Signed)
Post Partum Day 1, SVD after IOL at term. 2nd degr lac.  Subjective: no complaints, up ad lib, voiding, tolerating PO and + flatus  Objective: Blood pressure 110/72, pulse 87, temperature 98.2 F (36.8 C), temperature source Oral, resp. rate 17, height 5\' 2"  (1.575 m), weight 72.9 kg, last menstrual period 12/23/2019, SpO2 99 %, unknown if currently breastfeeding.  Physical Exam:  General: alert and cooperative  Lungs CTA bilt CV RRR  Lochia: appropriate Uterine Fundus: firm Incision: no significant drainage DVT Evaluation: No evidence of DVT seen on physical exam. Negative Homan's sign.  Recent Labs    09/26/20 1117  HGB 13.0  HCT 39.6  Rh pos  Rub Imm   Assessment/Plan: PPD 1, doing well. Routine PP care Anticipate Dc tomorrow Circumcision d/w couple, wants to proceed with it today, await Peds clearanace    LOS: 1 day   Robin Griffin 09/27/2020, 1:24 PM

## 2020-09-27 NOTE — Lactation Note (Signed)
This note was copied from a baby's chart. Lactation Consultation Note  Patient Name: Robin Griffin Date: 09/27/2020 Reason for consult: Follow-up assessment  Follow up visit to 29 hours infant with 2.75% weight lost. Mother is P2.  Parents state breastfeeding is going well. No questions or needs at this time.   Parents are aware to call lactation as needed.      Feeding Feeding Type: Breast Fed  LATCH Score Latch: Repeated attempts needed to sustain latch, nipple held in mouth throughout feeding, stimulation needed to elicit sucking reflex.  Audible Swallowing: A few with stimulation  Type of Nipple: Everted at rest and after stimulation  Comfort (Breast/Nipple): Filling, red/small blisters or bruises, mild/mod discomfort  Hold (Positioning): Assistance needed to correctly position infant at breast and maintain latch.  LATCH Score: 6  Interventions Interventions: Breast feeding basics reviewed  Lactation Tools Discussed/Used Tools: Nipple [place] [employer]   Consult Status Consult Status: Follow-up Date: 09/28/20 Follow-up type: In-patient    Robin Griffin 09/27/2020, 8:41 PM

## 2020-09-28 MED ORDER — BENZOCAINE-MENTHOL 20-0.5 % EX AERO: 1.0000 "application " | INHALATION_SPRAY | CUTANEOUS | 1 refills | Status: DC | PRN

## 2020-09-28 MED ORDER — IBUPROFEN 600 MG PO TABS
600.0000 mg | ORAL_TABLET | Freq: Four times a day (QID) | ORAL | 0 refills | Status: DC
Start: 2020-09-28 — End: 2020-11-23

## 2020-09-28 MED ORDER — ACETAMINOPHEN 325 MG PO TABS
650.0000 mg | ORAL_TABLET | ORAL | 1 refills | Status: DC | PRN
Start: 2020-09-28 — End: 2020-11-23

## 2020-09-28 NOTE — Discharge Summary (Signed)
OB Discharge Summary  Patient Name: Robin Griffin DOB: May 18, 1991 MRN: [id number]  Date of admission: 09/26/2020 Delivering provider: MODY, VAISHALI   Admitting diagnosis: Encounter for induction of labor [Z34.90] SVD (spontaneous vaginal delivery) [O80] Intrauterine pregnancy: [redacted]w[redacted]d     Secondary diagnosis: Patient Active Problem List   Diagnosis Date Noted  . Postpartum care following vaginal delivery 10/30 09/28/2020  . Second degree perineal laceration 09/28/2020  . Encounter for induction of labor 09/26/2020  . SVD (spontaneous vaginal delivery) 09/26/2020  . Disorder of salivary gland 11/19/2019  . Axillary lymphadenopathy 11/19/2019  . Seborrhea capitis in adult 11/19/2019  . Dermoid cyst of left ovary 08/16/2016    Date of discharge: 09/28/2020   Discharge diagnosis: Principal Problem:   Postpartum care following vaginal delivery 10/30 Active Problems:   Encounter for induction of labor   SVD (spontaneous vaginal delivery)   Second degree perineal laceration                                                        Post partum procedures:None  Augmentation: AROM and Pitocin Pain control: Epidural  Laceration:2nd degree;Perineal  Episiotomy:None  Complications: None  Hospital course:  Induction of Labor With Vaginal Delivery   29 y.o. yo [id number] at [redacted]w[redacted]d was admitted to the hospital 09/26/2020 for induction of labor.  Indication for induction: Favorable cervix at term.  Patient had an uncomplicated labor course as follows: Membrane Rupture Time/Date: 1:32 PM ,09/26/2020   Delivery Method:Vaginal, Spontaneous  Episiotomy: None  Lacerations:  2nd degree;Perineal  Details of delivery can be found in separate delivery note.  Patient had a routine postpartum course. Patient is discharged home 09/28/20.  Newborn Data: Birth date:09/26/2020  Birth time:3:38 PM  Gender:Female  Living status:Living  Apgars:9 ,9  Weight:3600 g   Physical exam  Vitals:   09/27/20 0833  09/27/20 1400 09/27/20 2125 09/28/20 0709  BP: 110/72 111/72 112/82 114/73  Pulse: 87 82 (!) 109 96  Resp: 17 17 18 16   Temp: 98.2 F (36.8 C) 98 F (36.7 C) 98.9 F (37.2 C) 97.9 F (36.6 C)  TempSrc: Oral  Oral Oral  SpO2: 99% 98% 98% 100%  Weight:      Height:       General: alert, cooperative and no distress Lochia: appropriate Uterine Fundus: firm Perineum: repair intact, no edema DVT Evaluation: No evidence of DVT seen on physical exam. No significant calf/ankle edema. Labs: Lab Results  Component Value Date   WBC 10.6 (H) 09/26/2020   HGB 13.0 09/26/2020   HCT 39.6 09/26/2020   MCV 90.4 09/26/2020   PLT 225 09/26/2020   CMP Latest Ref Rng & Units 11/27/2019  Glucose 65 - 99 mg/dL 82  BUN 6 - 20 mg/dL 9  Creatinine 0.57 - 1.00 mg/dL 0.72  Sodium 134 - 144 mmol/L 140  Potassium 3.5 - 5.2 mmol/L 4.1  Chloride 96 - 106 mmol/L 105  CO2 20 - 29 mmol/L 21  Calcium 8.7 - 10.2 mg/dL 9.0  Total Protein 6.0 - 8.5 g/dL 6.7  Total Bilirubin 0.0 - 1.2 mg/dL 0.2  Alkaline Phos 39 - 117 IU/L 76  AST 0 - 40 IU/L 35  ALT 0 - 32 IU/L 47(H)   Edinburgh Postnatal Depression Scale Screening Tool 09/27/2020 09/26/2020  I have been able to laugh and see  the funny side of things. (No Data) (No Data)    Vaccines: TDaP UTD         Flu    Offered at discharge         COVID-19   UTD  Discharge instructions:  per After Visit Summary and [facility] OB booklet  After Visit Meds:  Allergies as of 09/28/2020   No Known Allergies     Medication List    STOP taking these medications   KETOCONAZOLE (TOPICAL) 1 % Sham     TAKE these medications   acetaminophen 325 MG tablet Commonly known as: Tylenol Take 2 tablets (650 mg total) by mouth every 4 (four) hours as needed (for pain scale < 4).   benzocaine-Menthol 20-0.5 % Aero Commonly known as: DERMOPLAST Apply 1 application topically as needed for irritation (perineal discomfort).   ibuprofen 600 MG tablet Commonly known as:  ADVIL Take 1 tablet (600 mg total) by mouth every 6 (six) hours.            Discharge Care Instructions  (From admission, onward)         Start     Ordered   09/28/20 0000  Discharge wound care:       Comments: Sitz baths 2 times /day with warm water x 1 week. May add herbals: 1 ounce dried comfrey leaf* 1 ounce calendula flowers 1 ounce lavender flowers  Supplies can be found online at [employer] sources at [employer], [employer]  1/2 ounce dried uva ursi leaves 1/2 ounce witch hazel blossoms (if you can find them) 1/2 ounce dried sage leaf 1/2 cup sea salt Directions: Bring 2 quarts of water to a boil. Turn off heat, and place 1 ounce (approximately 1 large handful) of the above mixed herbs (not the salt) into the pot. Steep, covered, for 30 minutes.  Strain the liquid well with a fine mesh strainer, and discard the herb material. Add 2 quarts of liquid to the tub, along with the 1/2 cup of salt. This medicinal liquid can also be made into compresses and peri-rinses.   09/28/20 1208         Diet: routine diet  Activity: Advance as tolerated. Pelvic rest for 6 weeks.   Newborn Data: Live born female  Birth Weight: 7 lb 15 oz (3600 g) APGAR: 24, 9  Newborn Delivery   Birth date/time: 09/26/2020 15:38:00 Delivery type: Vaginal, Spontaneous     Named [place] Baby Feeding: Breast Disposition:home with mother  Delivery Report:  Review the Delivery Report for details.    Follow up:  Follow-up Information    Azucena Fallen, MD. Schedule an appointment as soon as possible for a visit in 6 week(s).   Specialty: Obstetrics and Gynecology Why: Please make an appointment for 6 weeks postpartum.  Contact information: [place] [id number] [place] Shamariah Shewmake, CNM, MSN 09/28/2020, 12:09 PM

## 2020-09-28 NOTE — Lactation Note (Signed)
This note was copied from a baby's chart. Lactation Consultation Note  Patient Name: Robin Griffin [id number] Date: 09/28/2020 Reason for consult: Follow-up assessment   P1, Baby 18 hours old.  Mother is using #20NS occasionally due to sore nipples. She states infant does not open wide enough.  During the night, he cluster fed per parents and she did not use NS.   Noted abrasions on tips of nipples.  No cracks or bleeding. Mother is alternating with coconut oil and comfort gels. Suggest family calling for LC to view next feeding. Mother has personal DEBP at home. Recommend if using NS regularly, mother should be pumping with each feeding. Set up DEBP.  Reviewed cleaning and milk storage. Reviewed engorgement care and monitoring voids/stools.     Maternal Data Has patient been taught Hand Expression?: Yes  Feeding Feeding Type: Bottle Fed - Formula  LATCH Score                   Interventions Interventions: Breast feeding basics reviewed;Hand express;DEBP;Hand pump;Comfort gels;Coconut oil  Lactation Tools Discussed/Used Pump Review: Setup, frequency, and cleaning;Milk Storage Initiated by:: Vivianne Master Date initiated:: 09/28/20   Consult Status Consult Status: Follow-up Date: 09/28/20 Follow-up type: In-patient    Vivianne Master [facility] 09/28/2020, 10:23 AM

## 2020-11-23 ENCOUNTER — Ambulatory Visit (INDEPENDENT_AMBULATORY_CARE_PROVIDER_SITE_OTHER): Payer: 59 | Admitting: Physician Assistant

## 2020-11-23 ENCOUNTER — Other Ambulatory Visit: Payer: Self-pay

## 2020-11-23 ENCOUNTER — Encounter: Payer: Self-pay | Admitting: Physician Assistant

## 2020-11-23 VITALS — BP 110/75 | HR 105 | Temp 98.7°F | Resp 16 | Ht 62.0 in | Wt 146.4 lb

## 2020-11-23 DIAGNOSIS — Z6826 Body mass index (BMI) 26.0-26.9, adult: Secondary | ICD-10-CM

## 2020-11-23 DIAGNOSIS — Z1159 Encounter for screening for other viral diseases: Secondary | ICD-10-CM

## 2020-11-23 DIAGNOSIS — E049 Nontoxic goiter, unspecified: Secondary | ICD-10-CM | POA: Diagnosis not present

## 2020-11-23 DIAGNOSIS — Z0000 Encounter for general adult medical examination without abnormal findings: Secondary | ICD-10-CM

## 2020-11-23 DIAGNOSIS — Z23 Encounter for immunization: Secondary | ICD-10-CM

## 2020-11-23 NOTE — Patient Instructions (Signed)
Preventive Care 20-31 Years Old, Female Preventive care refers to visits with your health care provider and lifestyle choices that can promote health and wellness. This includes:  A yearly physical exam. This may also be called an annual well check.  Regular dental visits and eye exams.  Immunizations.  Screening for certain conditions.  Healthy lifestyle choices, such as eating a healthy diet, getting regular exercise, not using drugs or products that contain nicotine and tobacco, and limiting alcohol use. What can I expect for my preventive care visit? Physical exam Your health care provider will check your:  Height and weight. This may be used to calculate body mass index (BMI), which tells if you are at a healthy weight.  Heart rate and blood pressure.  Skin for abnormal spots. Counseling Your health care provider may ask you questions about your:  Alcohol, tobacco, and drug use.  Emotional well-being.  Home and relationship well-being.  Sexual activity.  Eating habits.  Work and work Statistician.  Method of birth control.  Menstrual cycle.  Pregnancy history. What immunizations do I need?  Influenza (flu) vaccine  This is recommended every year. Tetanus, diphtheria, and pertussis (Tdap) vaccine  You may need a Td booster every 10 years. Varicella (chickenpox) vaccine  You may need this if you have not been vaccinated. Human papillomavirus (HPV) vaccine  If recommended by your health care provider, you may need three doses over 6 months. Measles, mumps, and rubella (MMR) vaccine  You may need at least one dose of MMR. You may also need a second dose. Meningococcal conjugate (MenACWY) vaccine  One dose is recommended if you are age 75-21 years and a first-year college student living in a residence hall, or if you have one of several medical conditions. You may also need additional booster doses. Pneumococcal conjugate (PCV13) vaccine  You may need  this if you have certain conditions and were not previously vaccinated. Pneumococcal polysaccharide (PPSV23) vaccine  You may need one or two doses if you smoke cigarettes or if you have certain conditions. Hepatitis A vaccine  You may need this if you have certain conditions or if you travel or work in places where you may be exposed to hepatitis A. Hepatitis B vaccine  You may need this if you have certain conditions or if you travel or work in places where you may be exposed to hepatitis B. Haemophilus influenzae type b (Hib) vaccine  You may need this if you have certain conditions. You may receive vaccines as individual doses or as more than one vaccine together in one shot (combination vaccines). Talk with your health care provider about the risks and benefits of combination vaccines. What tests do I need?  Blood tests  Lipid and cholesterol levels. These may be checked every 5 years starting at age 33.  Hepatitis C test.  Hepatitis B test. Screening  Diabetes screening. This is done by checking your blood sugar (glucose) after you have not eaten for a while (fasting).  Sexually transmitted disease (STD) testing.  BRCA-related cancer screening. This may be done if you have a family history of breast, ovarian, tubal, or peritoneal cancers.  Pelvic exam and Pap test. This may be done every 3 years starting at age 76. Starting at age 102, this may be done every 5 years if you have a Pap test in combination with an HPV test. Talk with your health care provider about your test results, treatment options, and if necessary, the need for more tests.  Follow these instructions at home: Eating and drinking   Eat a diet that includes fresh fruits and vegetables, whole grains, lean protein, and low-fat dairy.  Take vitamin and mineral supplements as recommended by your health care provider.  Do not drink alcohol if: ? Your health care provider tells you not to drink. ? You are  pregnant, may be pregnant, or are planning to become pregnant.  If you drink alcohol: ? Limit how much you have to 0-1 drink a day. ? Be aware of how much alcohol is in your drink. In the [place] one drink equals one 12 oz bottle of beer (355 mL), one 5 oz glass of wine (148 mL), or one 1 oz glass of hard liquor (44 mL). Lifestyle  Take daily care of your teeth and gums.  Stay active. Exercise for at least 30 minutes on 5 or more days each week.  Do not use any products that contain nicotine or tobacco, such as cigarettes, e-cigarettes, and chewing tobacco. If you need help quitting, ask your health care provider.  If you are sexually active, practice safe sex. Use a condom or other form of birth control (contraception) in order to prevent pregnancy and STIs (sexually transmitted infections). If you plan to become pregnant, see your health care provider for a preconception visit. What's next?  Visit your health care provider once a year for a well check visit.  Ask your health care provider how often you should have your eyes and teeth checked.  Stay up to date on all vaccines. This information is not intended to replace advice given to you by your health care provider. Make sure you discuss any questions you have with your health care provider. Document Revised: 07/26/2018 Document Reviewed: 07/26/2018 Elsevier Patient Education  2020 [employer].

## 2020-11-23 NOTE — Progress Notes (Signed)
Complete physical exam   Patient: Robin Griffin   DOB: 01-21-1991   29 y.o. Female  MRN: [id number] Visit Date: 11/23/2020  Today's healthcare provider: Margaretann Loveless, PA-C   Chief Complaint  Patient presents with  . Annual Exam   Subjective    Robin Griffin is a 29 y.o. female who presents today for a complete physical exam.  She reports consuming a general diet. The patient does not participate in regular exercise at present. She generally feels well. She reports sleeping fairly well. She does not have additional problems to discuss today.  HPI  01/21/2020 Pap/HPV-negative Influenza vaccine: she received it on October Covid Booster: [facility] 11/19/20   Past Medical History:  Diagnosis Date  . Dermoid cyst of left ovary 08/16/2016  . Hypothyroidism   . Seizures (HCC)    childhood with fever   Past Surgical History:  Procedure Laterality Date  . LAPAROSCOPIC OVARIAN CYSTECTOMY Left 08/16/2016   Procedure: LAPAROSCOPIC OVARIAN CYSTECTOMY;  Surgeon: Shea Evans, MD;  Location: [facility];  Service: Gynecology;  Laterality: Left;   Social History   Socioeconomic History  . Marital status: Married    Spouse name: Not on file  . Number of children: Not on file  . Years of education: Not on file  . Highest education level: Not on file  Occupational History  . Not on file  Tobacco Use  . Smoking status: Never Smoker  . Smokeless tobacco: Never Used  Vaping Use  . Vaping Use: Never used  Substance and Sexual Activity  . Alcohol use: No  . Drug use: No  . Sexual activity: Yes  Other Topics Concern  . Not on file  Social History Narrative  . Not on file   Social Determinants of Health   Financial Resource Strain: Not on file  Food Insecurity: Not on file  Transportation Needs: Not on file  Physical Activity: Not on file  Stress: Not on file  Social Connections: Not on file  Intimate Partner Violence: Not on file   Family Status   Relation Name Status  . Mother  Alive  . Father  Alive  . Sister  Alive  . Brother  Alive  . Mat Uncle  Deceased   Family History  Problem Relation Age of Onset  . Asthma Maternal Uncle   . Cancer Maternal Uncle        lung   No Known Allergies  Patient Care Team: Margaretann Loveless, PA-C as PCP - General (Family Medicine)   Medications: Outpatient Medications Prior to Visit  Medication Sig  . levothyroxine (SYNTHROID) 25 MCG tablet Take 25 mcg by mouth every morning.  . Prenatal Vit-Fe Fumarate-FA (NAT-RUL PRENATAL VITAMINS PO) Take by mouth.  Marland Kitchen acetaminophen (TYLENOL) 325 MG tablet Take 2 tablets (650 mg total) by mouth every 4 (four) hours as needed (for pain scale < 4).  . benzocaine-Menthol (DERMOPLAST) 20-0.5 % AERO Apply 1 application topically as needed for irritation (perineal discomfort).  Marland Kitchen ibuprofen (ADVIL) 600 MG tablet Take 1 tablet (600 mg total) by mouth every 6 (six) hours.   No facility-administered medications prior to visit.    Review of Systems  Constitutional: Negative.   HENT: Negative.   Eyes: Negative.   Respiratory: Negative.   Cardiovascular: Negative.   Gastrointestinal: Negative.   Endocrine: Negative.   Genitourinary: Negative.   Musculoskeletal: Negative.   Skin: Negative.   Allergic/Immunologic: Negative.   Neurological: Negative.   Hematological: Negative.  Psychiatric/Behavioral: Negative.     Last CBC Lab Results  Component Value Date   WBC 10.6 (H) 09/26/2020   HGB 13.0 09/26/2020   HCT 39.6 09/26/2020   MCV 90.4 09/26/2020   MCH 29.7 09/26/2020   RDW 13.3 09/26/2020   PLT 225 09/26/2020   Last metabolic panel Lab Results  Component Value Date   GLUCOSE 82 11/27/2019   NA 140 11/27/2019   K 4.1 11/27/2019   CL 105 11/27/2019   CO2 21 11/27/2019   BUN 9 11/27/2019   CREATININE 0.72 11/27/2019   GFRNONAA 113 11/27/2019   GFRAA 130 11/27/2019   CALCIUM 9.0 11/27/2019   PROT 6.7 11/27/2019   ALBUMIN 4.3  11/27/2019   LABGLOB 2.4 11/27/2019   AGRATIO 1.8 11/27/2019   BILITOT 0.2 11/27/2019   ALKPHOS 76 11/27/2019   AST 35 11/27/2019   ALT 47 (H) 11/27/2019   Last lipids Lab Results  Component Value Date   CHOL 214 (H) 11/27/2019   HDL 57 11/27/2019   LDLCALC 148 (H) 11/27/2019   TRIG 52 11/27/2019   CHOLHDL 3.8 11/27/2019   Last hemoglobin A1c Lab Results  Component Value Date   HGBA1C 5.1 11/27/2019   Last thyroid functions Lab Results  Component Value Date   TSH 0.916 11/27/2019   Last vitamin D No results found for: 25OHVITD2, 25OHVITD3, VD25OH Last vitamin B12 and Folate No results found for: VITAMINB12, FOLATE    Objective    BP 110/75 (BP Location: Left Arm, Patient Position: Sitting, Cuff Size: Normal)   Pulse (!) 105   Temp 98.7 F (37.1 C) (Oral)   Resp 16   Ht 5\' 2"  (1.575 m)   Wt 146 lb 6.4 oz (66.4 kg)   LMP 12/23/2019 (Exact Date)   BMI 26.78 kg/m  BP Readings from Last 3 Encounters:  11/23/20 110/75  09/28/20 114/73  01/21/20 102/71   Wt Readings from Last 3 Encounters:  11/23/20 146 lb 6.4 oz (66.4 kg)  09/26/20 160 lb 11.2 oz (72.9 kg)  01/21/20 127 lb 9.6 oz (57.9 kg)      Physical Exam Vitals reviewed.  Constitutional:      General: She is not in acute distress.    Appearance: Normal appearance. She is well-developed, well-groomed, overweight and well-nourished. She is not diaphoretic.  HENT:     Head: Normocephalic and atraumatic.     Right Ear: Tympanic membrane, ear canal and external ear normal.     Left Ear: Tympanic membrane, ear canal and external ear normal.     Nose: Nose normal.     Mouth/Throat:     Mouth: Oropharynx is clear and moist. Mucous membranes are moist.     Pharynx: Oropharynx is clear. No oropharyngeal exudate or posterior oropharyngeal erythema.  Eyes:     General: No scleral icterus.       Right eye: No discharge.        Left eye: No discharge.     Extraocular Movements: Extraocular movements intact and  EOM normal.     Conjunctiva/sclera: Conjunctivae normal.     Pupils: Pupils are equal, round, and reactive to light.  Neck:     Thyroid: Thyromegaly present.     Vascular: No JVD.     Trachea: No tracheal deviation.   Cardiovascular:     Rate and Rhythm: Normal rate and regular rhythm.     Pulses: Normal pulses and intact distal pulses.     Heart sounds: Normal heart sounds. No murmur  heard. No friction rub. No gallop.   Pulmonary:     Effort: Pulmonary effort is normal. No respiratory distress.     Breath sounds: Normal breath sounds. No wheezing or rales.  Chest:     Chest wall: No tenderness.  Abdominal:     General: Abdomen is flat. Bowel sounds are normal. There is no distension.     Palpations: Abdomen is soft. There is no mass.     Tenderness: There is no abdominal tenderness. There is no guarding or rebound.  Genitourinary:    Comments: Deferred to GYN Musculoskeletal:        General: No tenderness or edema. Normal range of motion.     Cervical back: Normal range of motion and neck supple. No tenderness.     Right lower leg: No edema.     Left lower leg: No edema.  Lymphadenopathy:     Cervical: No cervical adenopathy.  Skin:    General: Skin is warm and dry.     Capillary Refill: Capillary refill takes less than 2 seconds.     Findings: No rash.  Neurological:     General: No focal deficit present.     Mental Status: She is alert and oriented to person, place, and time. Mental status is at baseline.  Psychiatric:        Mood and Affect: Mood and affect and mood normal.        Behavior: Behavior normal. Behavior is cooperative.        Thought Content: Thought content normal.        Judgment: Judgment normal.       Last depression screening scores PHQ 2/9 Scores 11/23/2020 11/19/2019 11/12/2018  PHQ - 2 Score 0 0 0  PHQ- 9 Score - 0 0   Last fall risk screening Fall Risk  11/19/2019  Falls in the past year? 0  Number falls in past yr: 0  Injury with  Fall? 0  Follow up Falls evaluation completed   Last Audit-C alcohol use screening Alcohol Use Disorder Test (AUDIT) 11/19/2019  1. How often do you have a drink containing alcohol? 0  2. How many drinks containing alcohol do you have on a typical day when you are drinking? 0  3. How often do you have six or more drinks on one occasion? 0  AUDIT-C Score 0  Alcohol Brief Interventions/Follow-up AUDIT Score <7 follow-up not indicated   A score of 3 or more in women, and 4 or more in men indicates increased risk for alcohol abuse, EXCEPT if all of the points are from question 1   No results found for any visits on 11/23/20.  Assessment & Plan    Routine Health Maintenance and Physical Exam  Exercise Activities and Dietary recommendations Goals   None     Immunization History  Administered Date(s) Administered  . Influenza,inj,Quad PF,6+ Mos 11/03/2016  . Influenza-Unspecified 10/06/2015, 09/20/2017  . PFIZER SARS-COV-2 Vaccination 03/13/2020, 04/04/2020    Health Maintenance  Topic Date Due  . Hepatitis C Screening  Never done  . TETANUS/TDAP  Never done  . INFLUENZA VACCINE  02/25/2021 (Originally 06/28/2020)  . PAP SMEAR-Modifier  01/20/2023  . COVID-19 Vaccine  Completed  . HIV Screening  Completed    Discussed health benefits of physical activity, and encouraged her to engage in regular exercise appropriate for her age and condition.  1. Annual physical exam Normal physical exam today. Will check labs as below and f/u pending lab results. If  labs are stable and WNL she will not need to have these rechecked for one year at her next annual physical exam. She is to call the office in the meantime if she has any acute issue, questions or concerns. - CBC with Differential/Platelet - Comprehensive metabolic panel - Lipid panel - TSH  2. Encounter for hepatitis C screening test for low risk patient Will check labs as below and f/u pending results. - Hepatitis C  antibody  3. BMI 26.0-26.9,adult Recently gave birth approx 7-8 weeks ago. Doing well and losing appropriately postpartum.   4. Enlarged thyroid Noted to have slightly enlarged thyroid on right side without a true palpable mass. Will check labs as below and f/u pending results. Patient on levothyroxine and followed by Endocrinology.  - TSH - levothyroxine (SYNTHROID) 25 MCG tablet; Take 25 mcg by mouth every morning.  5. Need for Tdap vaccination Was given by OB during pregnancy this year. No true documentation to update at this time.   6. Need for influenza vaccination Done in October before giving birth. No official documentation to update.    Return in about 1 year (around 11/23/2021).      Reynolds Bowl, PA-C, have reviewed all documentation for this visit. The documentation on 11/23/20 for the exam, diagnosis, procedures, and orders are all accurate and complete.   Rubye Beach  [facility] [phone] (phone) [phone] (fax)  [place]

## 2020-11-24 LAB — COMPREHENSIVE METABOLIC PANEL
ALT: 156 IU/L — ABNORMAL HIGH (ref 0–32)
AST: 66 IU/L — ABNORMAL HIGH (ref 0–40)
Albumin/Globulin Ratio: 1.6 (ref 1.2–2.2)
Albumin: 4.4 g/dL (ref 3.8–4.8)
Alkaline Phosphatase: 172 IU/L — ABNORMAL HIGH (ref 44–121)
BUN/Creatinine Ratio: 15 (ref 9–23)
BUN: 10 mg/dL (ref 6–20)
Bilirubin Total: 0.3 mg/dL (ref 0.0–1.2)
CO2: 23 mmol/L (ref 20–29)
Calcium: 10 mg/dL (ref 8.7–10.2)
Chloride: 101 mmol/L (ref 96–106)
Creatinine, Ser: 0.67 mg/dL (ref 0.57–1.00)
GFR calc Af Amer: 135 mL/min/{1.73_m2} (ref >59)
GFR calc non Af Amer: 118 mL/min/{1.73_m2} (ref >59)
Globulin, Total: 2.7 g/dL (ref 1.5–4.5)
Glucose: 72 mg/dL (ref 65–99)
Potassium: 4.8 mmol/L (ref 3.5–5.2)
Sodium: 139 mmol/L (ref 134–144)
Total Protein: 7.1 g/dL (ref 6.0–8.5)

## 2020-11-24 LAB — CBC WITH DIFFERENTIAL/PLATELET
Basophils Absolute: 0.1 10*3/uL (ref 0.0–0.2)
Basos: 1 %
EOS (ABSOLUTE): 0.5 10*3/uL — ABNORMAL HIGH (ref 0.0–0.4)
Eos: 6 %
Hematocrit: 40.5 % (ref 34.0–46.6)
Hemoglobin: 13.2 g/dL (ref 11.1–15.9)
Immature Grans (Abs): 0.1 10*3/uL (ref 0.0–0.1)
Immature Granulocytes: 1 %
Lymphocytes Absolute: 2.7 10*3/uL (ref 0.7–3.1)
Lymphs: 34 %
MCH: 28.3 pg (ref 26.6–33.0)
MCHC: 32.6 g/dL (ref 31.5–35.7)
MCV: 87 fL (ref 79–97)
Monocytes Absolute: 0.6 10*3/uL (ref 0.1–0.9)
Monocytes: 7 %
Neutrophils Absolute: 4 10*3/uL (ref 1.4–7.0)
Neutrophils: 51 %
Platelets: 324 10*3/uL (ref 150–450)
RBC: 4.67 x10E6/uL (ref 3.77–5.28)
RDW: 11.9 % (ref 11.7–15.4)
WBC: 7.9 10*3/uL (ref 3.4–10.8)

## 2020-11-24 LAB — TSH: TSH: 0.822 u[IU]/mL (ref 0.450–4.500)

## 2020-11-24 LAB — LIPID PANEL
Chol/HDL Ratio: 3.7 ratio (ref 0.0–4.4)
Cholesterol, Total: 227 mg/dL — ABNORMAL HIGH (ref 100–199)
HDL: 61 mg/dL (ref >39)
LDL Chol Calc (NIH): 154 mg/dL — ABNORMAL HIGH (ref 0–99)
Triglycerides: 68 mg/dL (ref 0–149)
VLDL Cholesterol Cal: 12 mg/dL (ref 5–40)

## 2020-11-24 LAB — HEPATITIS C ANTIBODY: Hep C Virus Ab: <0.1 s/co ratio (ref 0.0–0.9)

## 2020-12-01 ENCOUNTER — Telehealth: Payer: Self-pay

## 2020-12-01 DIAGNOSIS — R748 Abnormal levels of other serum enzymes: Secondary | ICD-10-CM

## 2020-12-01 NOTE — Telephone Encounter (Signed)
Patient advised as directed below. 

## 2020-12-01 NOTE — Telephone Encounter (Signed)
-----   Message from Margaretann Loveless, [place] sent at 11/27/2020 11:37 AM EST ----- Robin Griffin,  Your liver enzymes are quite elevated. I would recommend to limit use of any tylenol (acetaminophen) based products, fatty foods, red meats, and alcohol products. We should recheck these labs in 3-4 weeks. Also your cholesterol has increased compared to last year. Again diet changes with limiting fatty foods, red meats, processed meats and sugars in diet. All other labs are normal.   Daiva Nakayama, PAC

## 2021-01-04 IMAGING — US US FNA BIOPSY THYROID 1ST LESION
1 series · 13 of 18 positions shown · non-contrast
Comparison: Thyroid ultrasound dated 08/28/2019

MEDICATIONS:
None

COMPLICATIONS:
None immediate.

INDICATION: Patient with history of multinodular goiter and previous benign
ultrasound on 08/28/2019 revealed multinodular thyroid with slight
increase in size of right mid to inferior pole nodule, now measuring
4.7 cm with interval partial cystic degeneration. Request now
received for repeat biopsy of this right mid to inferior pole
thyroid nodule.

EXAM:
ULTRASOUND GUIDED FINE NEEDLE ASPIRATION BIOPSY OF RIGHT MID TO
INFERIOR POLE THYROID NODULE
TECHNIQUE: Informed written consent was obtained from the patient after a
discussion of the risks, benefits and alternatives to treatment.
Questions regarding the procedure were encouraged and answered. A
timeout was performed prior to the initiation of the procedure.

[Series 1: us fna biopsy thyroid 1st lesion · 0.06mm/px · 18 acquisitions, 13 frames shown]
[im 1/18]
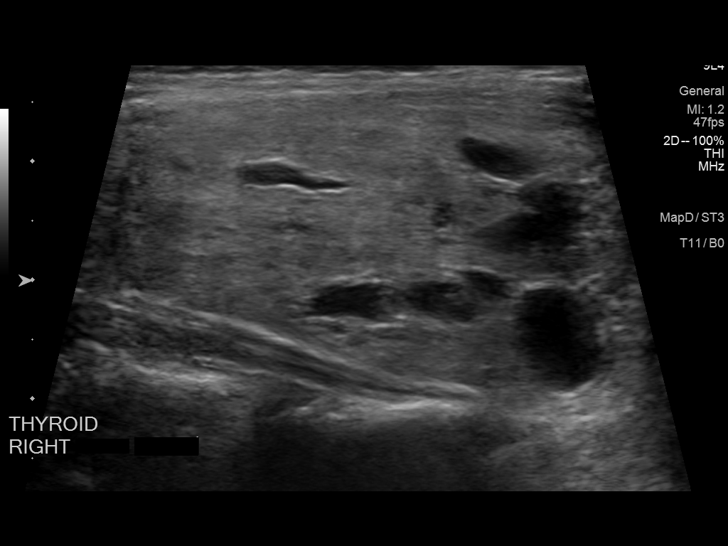
[im 3/18]
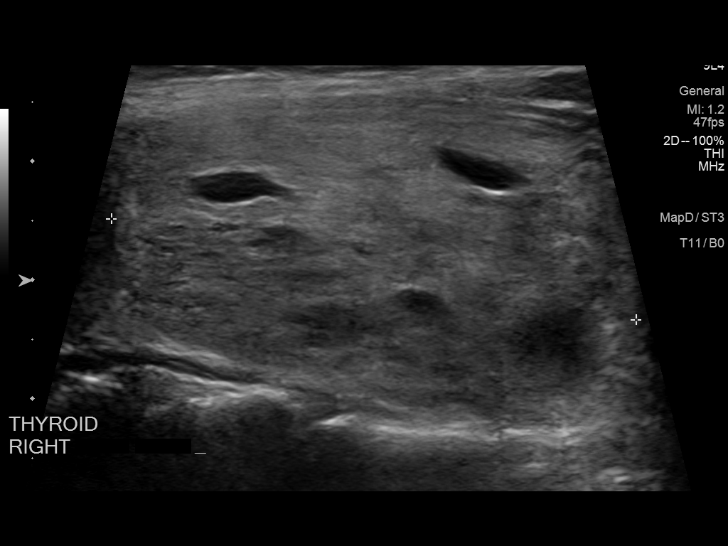
[im 4/18]
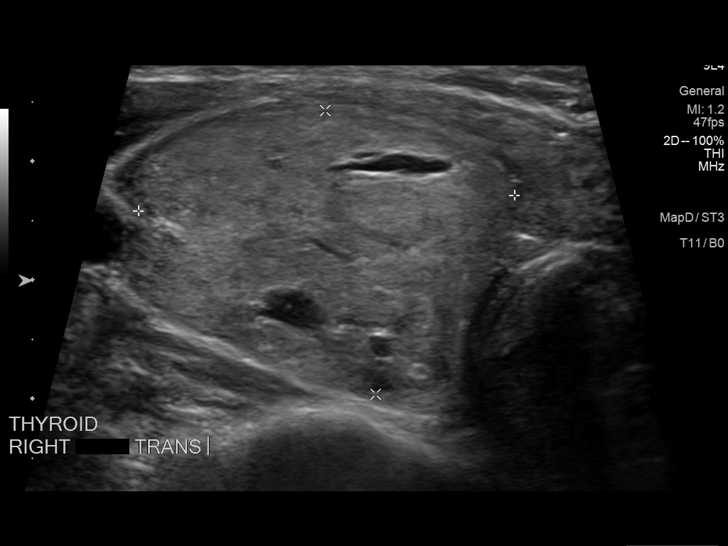
[im 5/18]
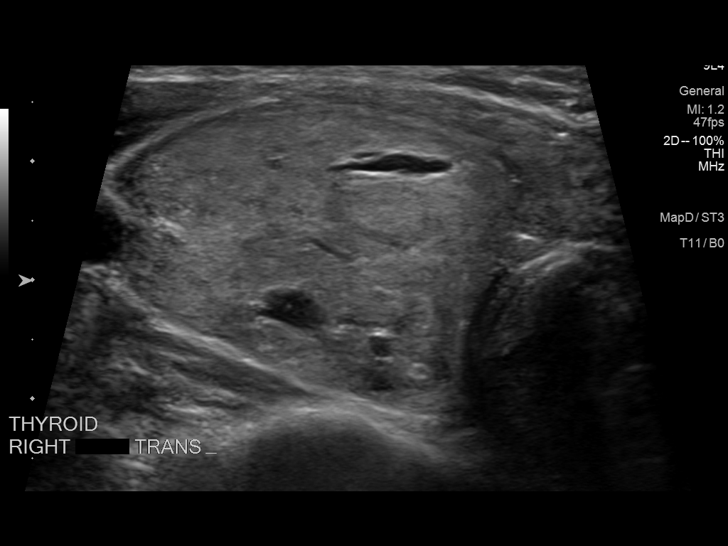
[im 7/18]
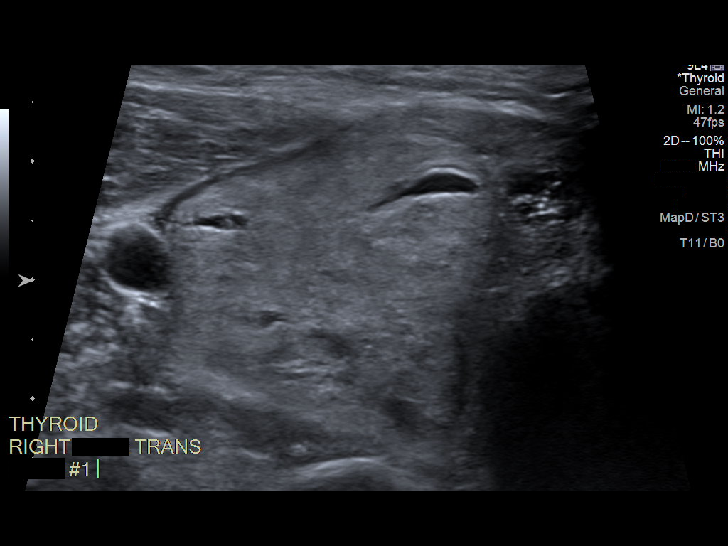
[im 8/18]
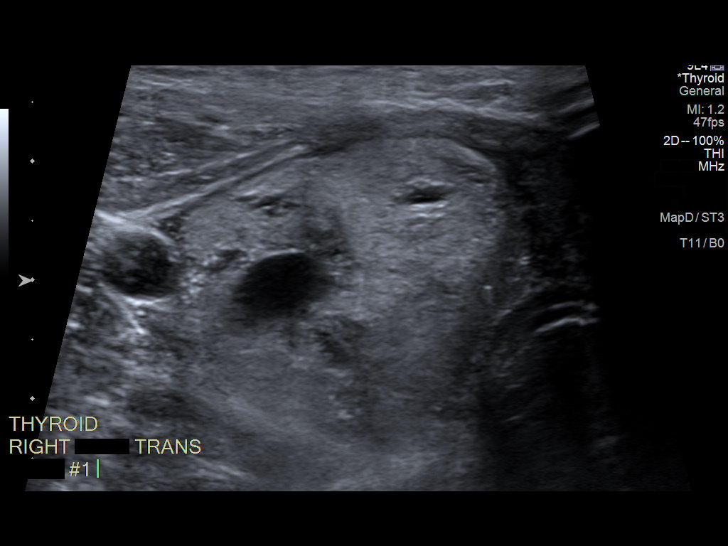
[im 10/18]
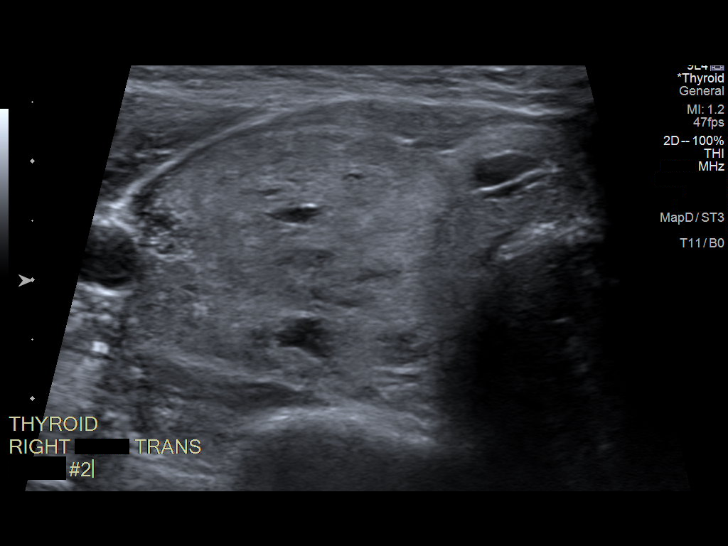
[im 11/18]
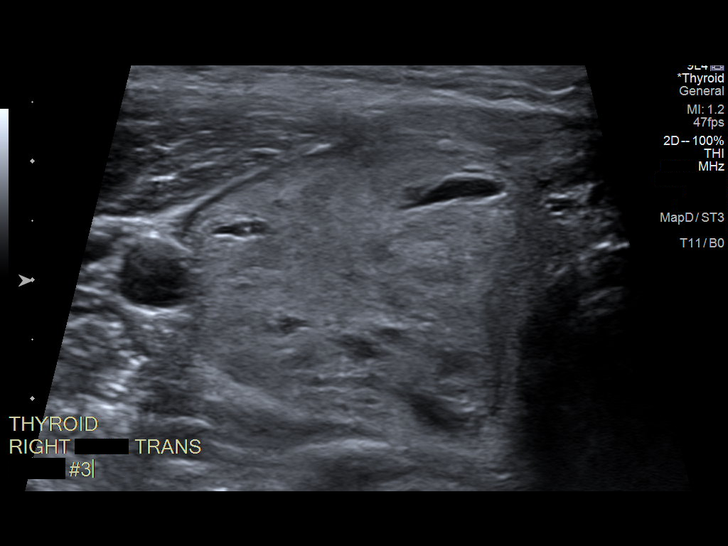
[im 12/18]
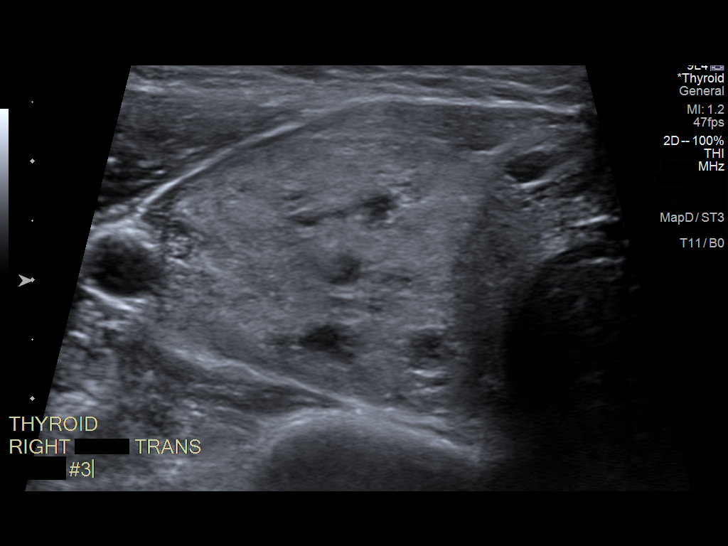
[im 14/18]
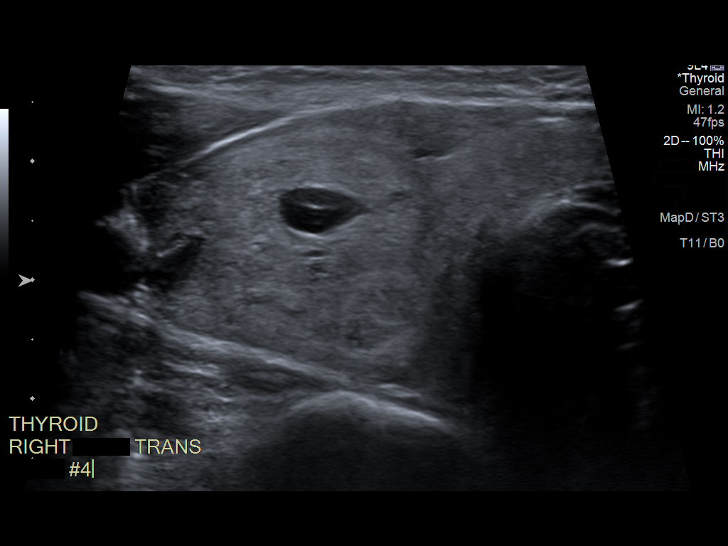
[im 15/18]
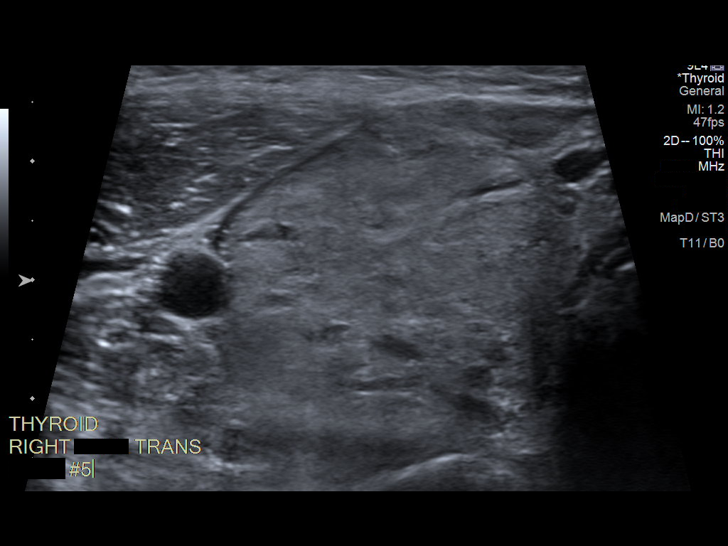
[im 16/18]
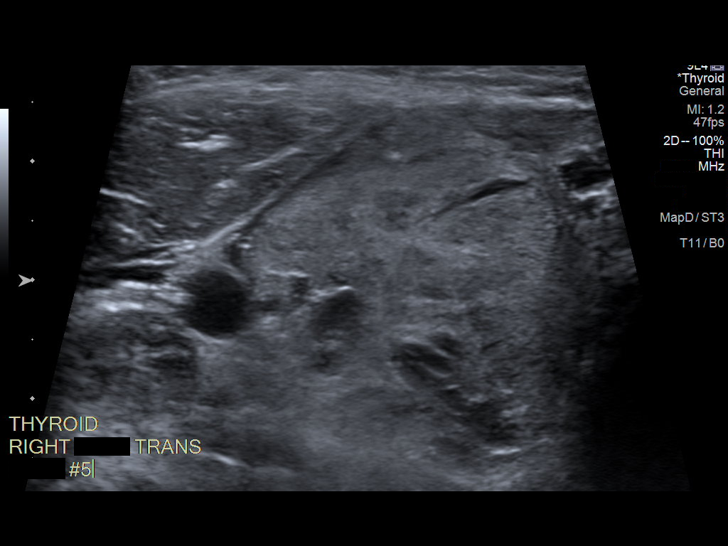
[im 18/18]
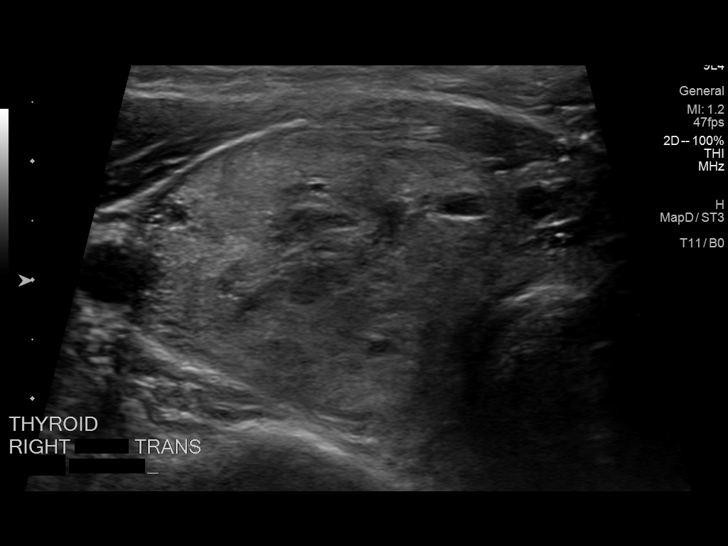

[13 of 18 positions shown; findings below may reference images not displayed]

Pre-procedural ultrasound scanning demonstrated unchanged size and
appearance of the indeterminate nodule within the right mid to
inferior thyroid lobe

The procedure was planned. The neck was prepped in the usual sterile
fashion, and a sterile drape was applied covering the operative
field. A timeout was performed prior to the initiation of the
procedure. Local anesthesia was provided with 1% lidocaine.

Under direct ultrasound guidance, 5 FNA biopsies were performed of
the right mid to inferior thyroid nodule with 25 gauge needles.
Multiple ultrasound images were saved for procedural documentation
purposes. The samples were prepared and submitted to pathology as
well as for Afirma testing.

Limited post procedural scanning was negative for hematoma or
additional complication. Dressings were placed. The patient
tolerated the above procedures procedure well without immediate
postprocedural complication.
FINDINGS: Nodule reference number based on prior diagnostic ultrasound: 3

Maximum size: 4.7 cm

Location: Right; Inferior

ACR TI-RADS risk category: TR4 (4-6 points)

Reason for biopsy: patient/referrer request

Ultrasound imaging confirms appropriate placement of the needles
within the thyroid nodule.
IMPRESSION: Technically successful ultrasound guided fine needle aspiration
biopsy of right mid to inferior pole thyroid nodule. Final pathology
pending.

## 2021-01-11 ENCOUNTER — Other Ambulatory Visit: Payer: Self-pay | Admitting: Endocrinology

## 2021-01-11 DIAGNOSIS — E041 Nontoxic single thyroid nodule: Secondary | ICD-10-CM

## 2021-01-19 ENCOUNTER — Ambulatory Visit
Admission: RE | Admit: 2021-01-19 | Discharge: 2021-01-19 | Disposition: A | Discharge status: Home / Self Care | Payer: 59 | Source: Ambulatory Visit | Attending: Endocrinology | Admitting: Endocrinology

## 2021-01-19 DIAGNOSIS — E041 Nontoxic single thyroid nodule: Secondary | ICD-10-CM

## 2021-01-30 LAB — HEPATIC FUNCTION PANEL
ALT: 136 IU/L — ABNORMAL HIGH (ref 0–32)
AST: 66 IU/L — ABNORMAL HIGH (ref 0–40)
Albumin: 4.6 g/dL (ref 3.8–4.8)
Alkaline Phosphatase: 178 IU/L — ABNORMAL HIGH (ref 44–121)
Bilirubin Total: 0.3 mg/dL (ref 0.0–1.2)
Bilirubin, Direct: 0.1 mg/dL (ref 0.00–0.40)
Total Protein: 7.2 g/dL (ref 6.0–8.5)

## 2021-02-01 ENCOUNTER — Telehealth: Payer: Self-pay | Admitting: Physician Assistant

## 2021-02-01 DIAGNOSIS — R748 Abnormal levels of other serum enzymes: Secondary | ICD-10-CM

## 2021-02-01 NOTE — Telephone Encounter (Signed)
Pt viewed her hepatic function panel on mychart and would like to know the next step

## 2021-02-02 ENCOUNTER — Other Ambulatory Visit: Payer: Self-pay | Admitting: Endocrinology

## 2021-02-02 DIAGNOSIS — E041 Nontoxic single thyroid nodule: Secondary | ICD-10-CM

## 2021-02-03 NOTE — Telephone Encounter (Signed)
Pt advised.  She agreed to proceed with the ultra sound  Thanks,   -Mickel Baas

## 2021-02-03 NOTE — Telephone Encounter (Signed)
Patient is waiting for a call from the doctor as to the next steps from her lab results.  She stated she is feeding her baby and wanted to get advice as to whether she should continue with the breast feeding or do something else.  Please advise and call patient to discuss at [phone]

## 2021-02-03 NOTE — Telephone Encounter (Signed)
I would recommend to get an ultrasound of the liver for evaluation. She can continue breastfeeding at this time.

## 2021-02-05 IMAGING — US US BREAST*R* LIMITED INC AXILLA
1 series · 7 of 7 positions shown · non-contrast
Comparison: None

CLINICAL DATA: 28-year-old patient with recent intermittent pain in
the upper-outer quadrant of the right breast. Additionally, possible
lymphadenopathy was palpated in the right axilla. Baseline
mammogram.

EXAM:
DIGITAL DIAGNOSTIC BILATERAL MAMMOGRAM WITH CAD AND TOMO
ULTRASOUND RIGHT BREAST

[Series 1: us breast*right* limited inc axilla · 0.06mm/px · 7 of 7 slices shown]
[im 1/7]
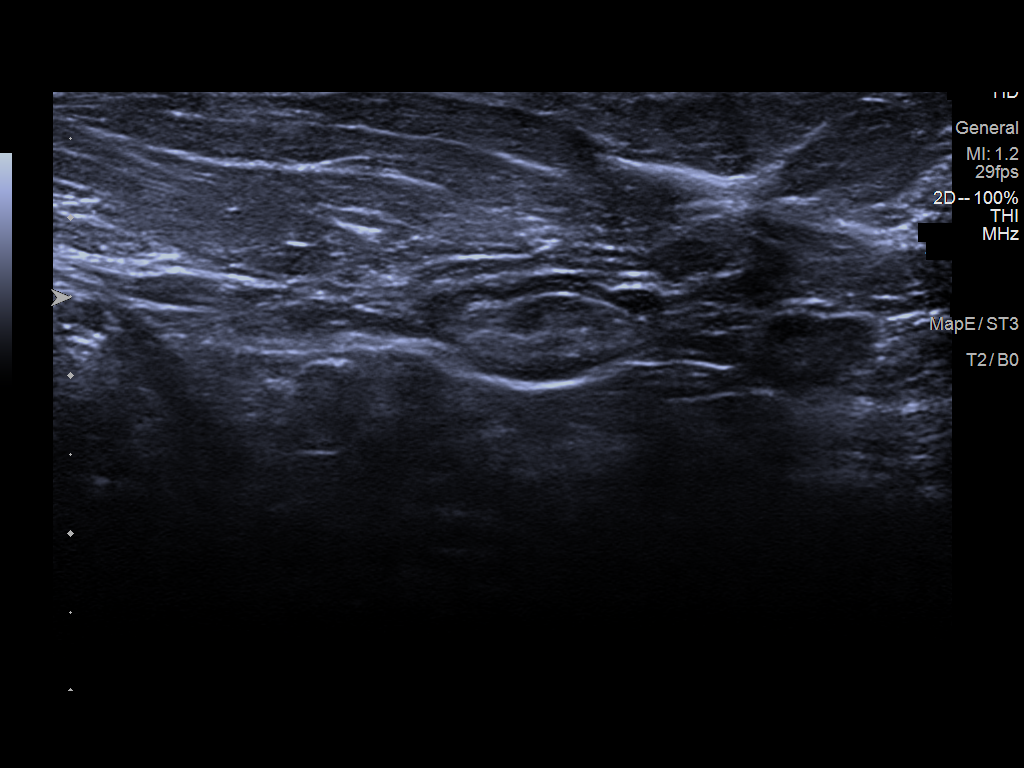
[im 2/7]
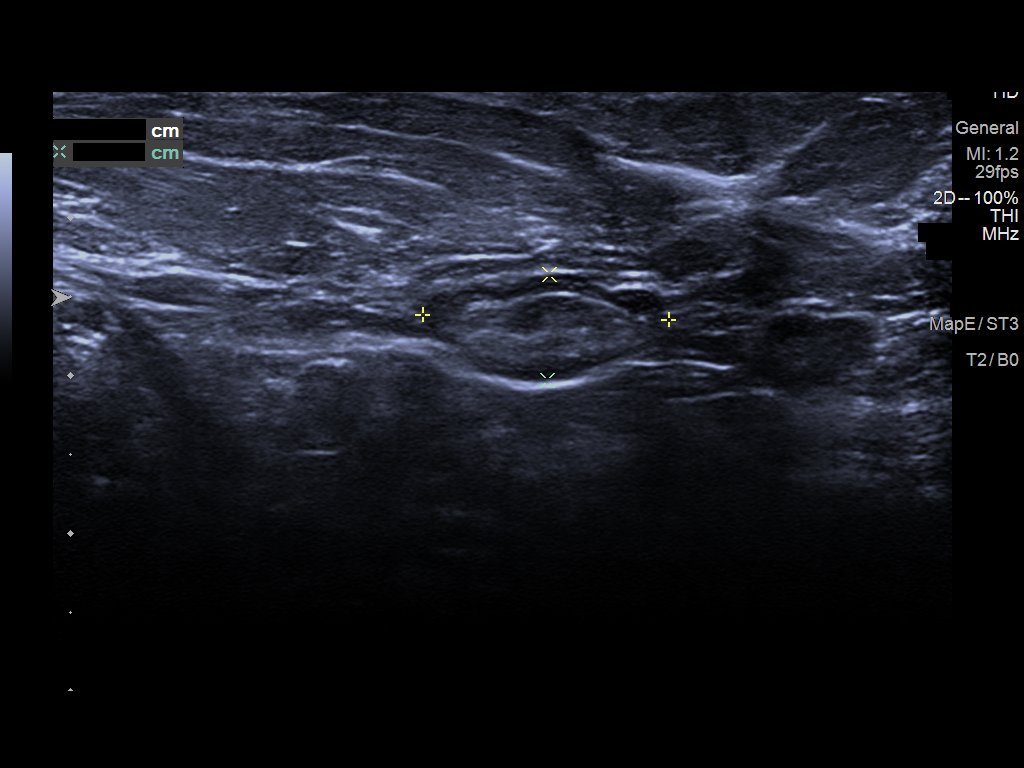
[im 3/7]
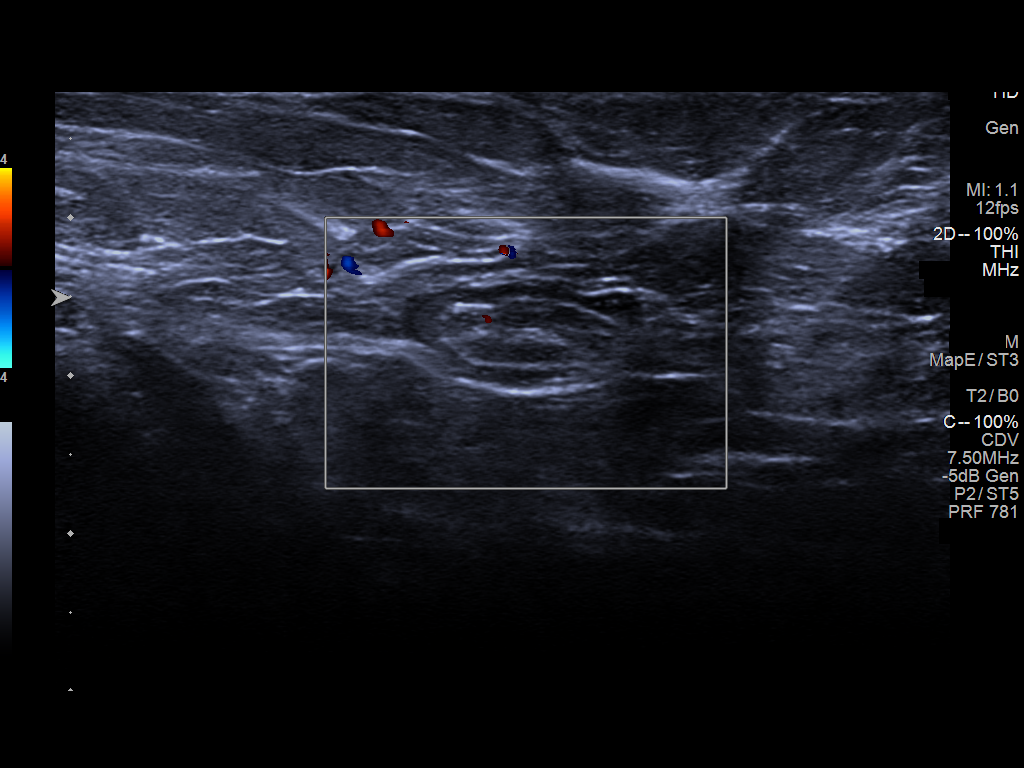
[im 4/7]
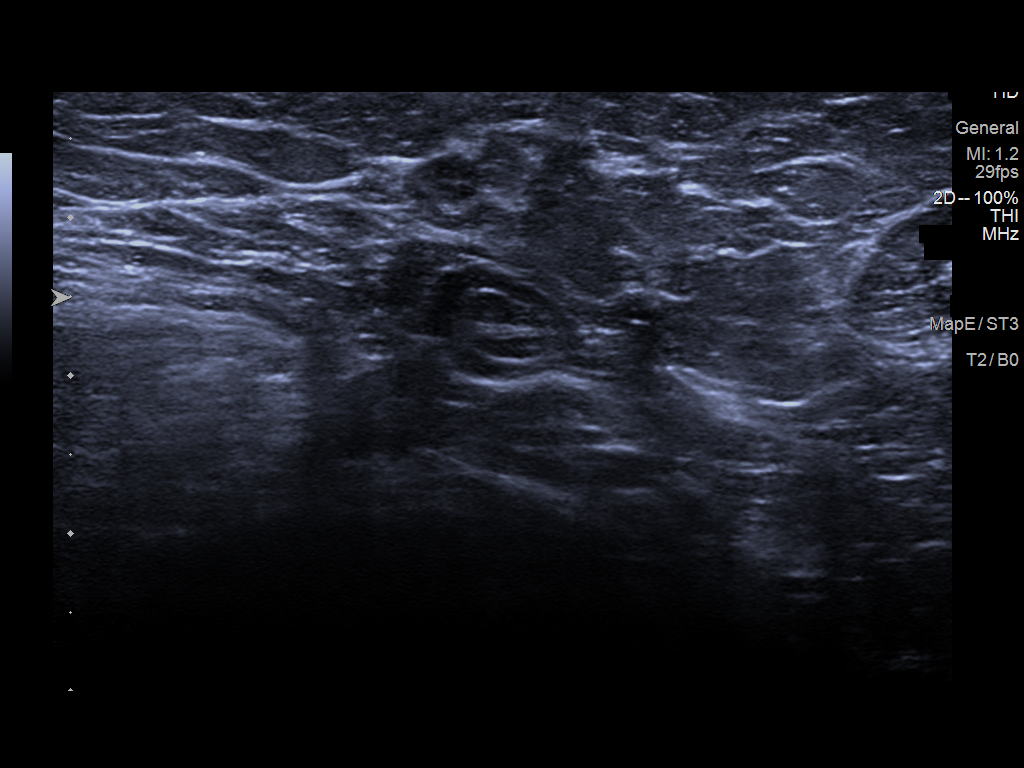
[im 5/7]
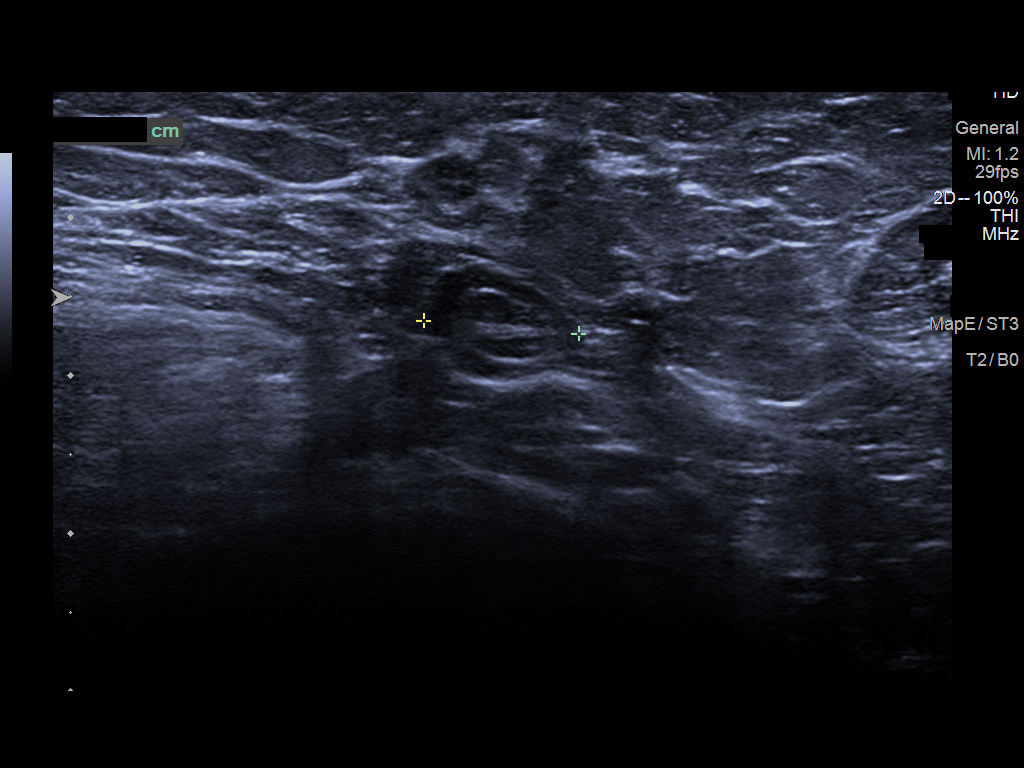
[im 6/7]
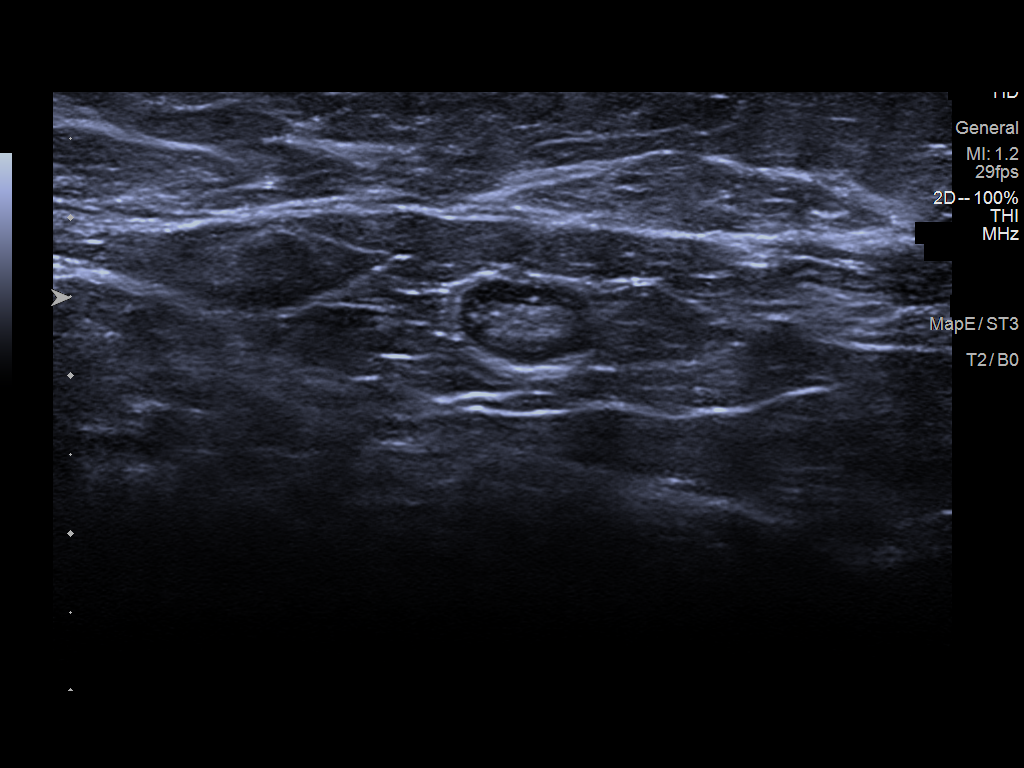
[im 7/7]
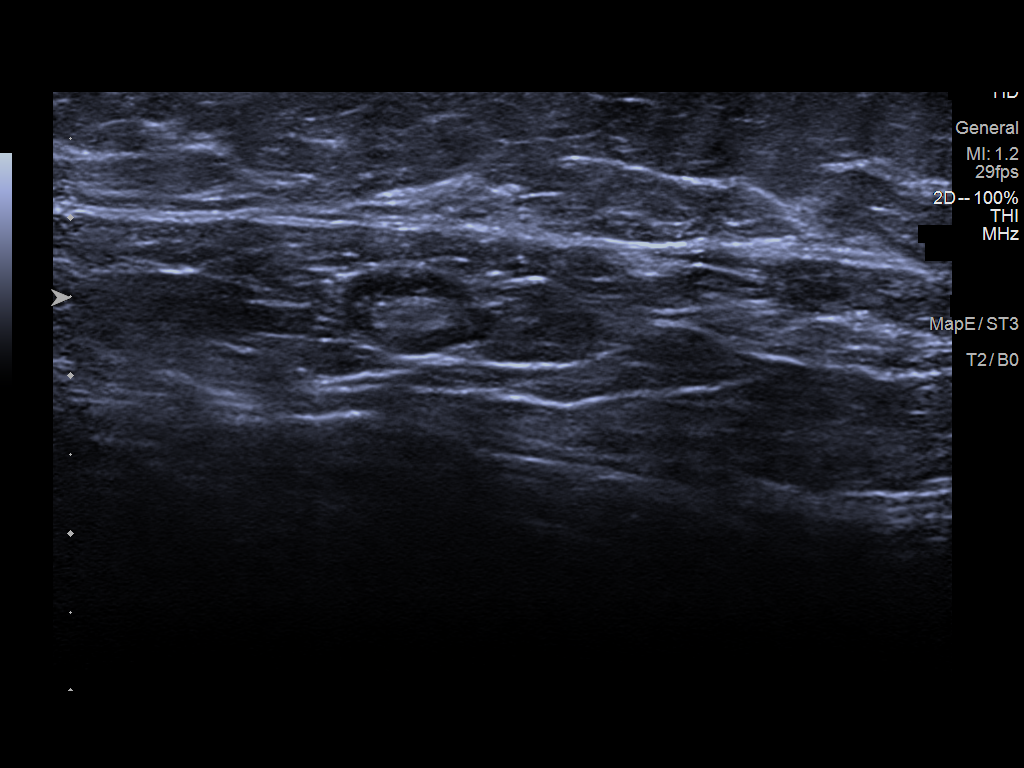

[7 of 7 positions shown; findings below may reference images not displayed]

ACR Breast Density Category c: The breast tissue is heterogeneously
dense, which may obscure small masses.
FINDINGS: No mass, architectural distortion, or suspicious microcalcification
is identified to suggest malignancy in either breast. Visualized
axillary regions are normal appearing bilaterally.

Mammographic images were processed with CAD.

Targeted ultrasound is performed, showing normal sonographic
appearance of the right axilla. Negative for lymphadenopathy or
mass.
IMPRESSION: No evidence of malignancy in either breast. Negative for right
axillary lymphadenopathy.

RECOMMENDATION:
Screening mammogram at age 38 unless there are persistent or
intervening clinical concerns. (Code:[id number]-[id number])

I have discussed the findings and recommendations with the patient.
If applicable, a reminder letter will be sent to the patient
regarding the next appointment.

BI-RADS CATEGORY  1: Negative.

## 2021-02-12 ENCOUNTER — Ambulatory Visit: Payer: Self-pay | Admitting: *Deleted

## 2021-02-12 ENCOUNTER — Encounter: Payer: Self-pay | Admitting: Family Medicine

## 2021-02-12 ENCOUNTER — Ambulatory Visit: Payer: 59 | Admitting: Adult Health

## 2021-02-12 ENCOUNTER — Telehealth (INDEPENDENT_AMBULATORY_CARE_PROVIDER_SITE_OTHER): Payer: 59 | Admitting: Family Medicine

## 2021-02-12 DIAGNOSIS — R748 Abnormal levels of other serum enzymes: Secondary | ICD-10-CM | POA: Diagnosis not present

## 2021-02-12 DIAGNOSIS — R7401 Elevation of levels of liver transaminase levels: Secondary | ICD-10-CM

## 2021-02-12 DIAGNOSIS — R110 Nausea: Secondary | ICD-10-CM | POA: Diagnosis not present

## 2021-02-12 NOTE — Progress Notes (Signed)
MyChart Video Visit    Virtual Visit via Video Note   This visit type was conducted due to national recommendations for restrictions regarding the COVID-19 Pandemic (e.g. social distancing) in an effort to limit this patient's exposure and mitigate transmission in our community. This patient is at least at moderate risk for complications without adequate follow up. This format is felt to be most appropriate for this patient at this time. Physical exam was limited by quality of the video and audio technology used for the visit.   Patient location: home Provider location: [facility]  I discussed the limitations of evaluation and management by telemedicine and the availability of in person appointments. The patient expressed understanding and agreed to proceed.  Patient: Robin Griffin   DOB: [birth year]   29 y.o. Female  MRN: [id number] Visit Date: 02/12/2021  Today's healthcare provider: Lelon Huh, MD   No chief complaint on file.  Subjective    HPI   Pt presents with nausea about two days. Nausea is mild, worse in the morning.Pt denies fever, vomiting, cough. Has been eating smaller meals to lose weight, but not changed foods she is eating. Pt is still taking current medications and denies changes in allergies/contradictions. Has 61 month old and is breast feeding. No new medications.   She was noted to have mildly elevated transaminases and alkaline phosphatase in December. She notes that during her pregnancy she consumed a lot more red meat than usually, which she has since cut out of her diet. LFTs were rechecked 2 weeks and essentially unchanged. Hep C ab in December was negative. She does not recall if she has had hep B vaccines. She is schedule for RUQ ultrasound next week.   She is mainly concerned that blood tests and nausea may be due to a serious liver disease which has her very anxious     Medications: Outpatient Medications Prior to Visit  Medication Sig  .  levothyroxine (SYNTHROID) 25 MCG tablet Take 25 mcg by mouth every morning.  . Prenatal Vit-Fe Fumarate-FA (NAT-RUL PRENATAL VITAMINS PO) Take by mouth.   No facility-administered medications prior to visit.    Review of Systems  Constitutional: Negative for appetite change, chills, fatigue and fever.  Respiratory: Negative for chest tightness and shortness of breath.   Cardiovascular: Negative for chest pain and palpitations.  Gastrointestinal: Positive for nausea. Negative for abdominal pain and vomiting.  Neurological: Negative for dizziness and weakness.      Objective    There were no vitals taken for this visit.   Physical Exam   Awake, alert, oriented x 3. In no apparent distress   Assessment & Plan     1. Nausea Mild, not requiring any medications. Suspect this is related to elevated LFTs as below.   2. Elevated transaminase level   3. Elevated alkaline phosphatase level  Not having any pain. Suspect gallbladder sludge or functional gallbladder disorder.  Is already scheduled for RUQ next week.     I discussed the assessment and treatment plan with the patient. The patient was provided an opportunity to ask questions and all were answered. The patient agreed with the plan and demonstrated an understanding of the instructions.   The patient was advised to call back or seek an in-person evaluation if the symptoms worsen or if the condition fails to improve as anticipated.  I provided 10 minutes of non-face-to-face time during this encounter.    Lelon Huh, MD [facility] [phone] (phone) [phone] (fax)  [place]

## 2021-02-12 NOTE — Telephone Encounter (Signed)
Pt called with complaints of nausea that started 02/10/21; she has not vomited; the pt is concerned because she was told that her liver enzymes are elevated and her appt with Laverna Peace was rescheduled; the pt says that she has only been able to tolerate water; recommendations made per nurse triage protocol; spoke with Elmyra Ricks and Arbie Cookey regarding scheduling the pt; ok to schedule pt for virtual appt with Dr Caryn Section; pt offered and accepted MyChart appt with Dr Caryn Section 02/12/21 at 1300; the pt verbalized understanding.   Reason for Disposition . Unexplained nausea  Answer Assessment - Initial Assessment Questions 1. NAUSEA SEVERITY: "How bad is the nausea?" (e.g., mild, moderate, severe; dehydration, weight loss)   - MILD: loss of appetite without change in eating habits   - MODERATE: decreased oral intake without significant weight loss, dehydration, or malnutrition   - SEVERE: inadequate caloric or fluid intake, significant weight loss, symptoms of dehydration     mild 2. ONSET: "When did the nausea begin?"   02/10/21 3. VOMITING: "Any vomiting?" If Yes, ask: "How many times today?"    no 4. RECURRENT SYMPTOM: "Have you had nausea before?" If Yes, ask: "When was the last time?" "What happened that time?"    After delivery 5. CAUSE: "What do you think is causing the nausea?"    Liver enzymes elevated 6. PREGNANCY: "Is there any chance you are pregnant?" (e.g., unprotected intercourse, missed birth control pill, broken condom)     No LMP 02/04/21  Protocols used: NAUSEA-A-AH

## 2021-02-17 ENCOUNTER — Other Ambulatory Visit: Payer: Self-pay

## 2021-02-17 ENCOUNTER — Ambulatory Visit
Admission: RE | Admit: 2021-02-17 | Discharge: 2021-02-17 | Disposition: A | Discharge status: Home / Self Care | Payer: 59 | Source: Ambulatory Visit | Attending: Physician Assistant | Admitting: Physician Assistant

## 2021-02-17 DIAGNOSIS — R748 Abnormal levels of other serum enzymes: Secondary | ICD-10-CM | POA: Diagnosis present

## 2021-02-18 ENCOUNTER — Telehealth: Payer: Self-pay

## 2021-02-18 DIAGNOSIS — K760 Fatty (change of) liver, not elsewhere classified: Secondary | ICD-10-CM

## 2021-02-18 DIAGNOSIS — R748 Abnormal levels of other serum enzymes: Secondary | ICD-10-CM

## 2021-02-18 NOTE — Telephone Encounter (Signed)
-----   Message from Mar Daring, [place] sent at 02/18/2021  8:28 AM EDT ----- US of the right upper quadrant shows fatty liver. Since you are so young, I could refer you to a gastroenterologist to have this monitored.

## 2021-02-18 NOTE — Telephone Encounter (Signed)
Referral placed.

## 2021-02-18 NOTE — Telephone Encounter (Signed)
Patient was advised and reports she would like the referral placed for the GI doctor. I advised patient and husband that a referral coordinator or the office the referral was placed to will reach out to her for appointment.

## 2021-03-08 ENCOUNTER — Telehealth: Payer: Self-pay

## 2021-03-08 NOTE — Telephone Encounter (Signed)
I called and spoke with patient to clarify her request. Patient states her GI appointment is scheduled for 04/19/2021 with [facility] clinic. This was the soonest that they had available. Patient wants to know if there are any labs or testing that our office needs to order to continue monitoring her condition until she gets in with GI? Please advise.

## 2021-03-08 NOTE — Telephone Encounter (Signed)
Copied from [place] [id number]. Topic: General - Other >> Mar 08, 2021  2:46 PM Yvette Rack wrote: Reason for CRM: Pt stated the appt with [facility] clinic is in May and she would like to know if any of the testing could be done prior to seeing the specialist. Pt requests call back.

## 2021-03-08 NOTE — Telephone Encounter (Signed)
No, there is no other urgent testing needing for fatty liver seen on ultrasound.

## 2021-03-09 NOTE — Telephone Encounter (Signed)
Patient advised and verbalized understanding 

## 2021-06-02 ENCOUNTER — Telehealth: Payer: Self-pay

## 2021-06-02 NOTE — Telephone Encounter (Signed)
Please review.  Can anyone work her in this week?  Thanks,   -Mickel Baas

## 2021-06-02 NOTE — Telephone Encounter (Signed)
All I see is 4pm Friday.

## 2021-06-02 NOTE — Telephone Encounter (Signed)
Copied from [place] [id number]. Topic: Appointment Scheduling - Scheduling Inquiry for Clinic >> Jun 02, 2021  1:17 PM Valere Dross wrote: Reason for CRM: Pt [place] and wanted to see one of the providers this week, about a check up and and previous liver issue she had before. Pt requested a call back, please advise.

## 2021-07-05 ENCOUNTER — Telehealth: Payer: Self-pay

## 2021-07-05 NOTE — Telephone Encounter (Signed)
Copied from [place] [id number]. Topic: Appointment Scheduling - Scheduling Inquiry for Clinic >> Jul 05, 2021  3:17 PM Loma [place] wrote: Pt [phone] Pls Fu pt has pain in right hand thinks the way holding her 36 month old, Pt was told Dr Caryn Section not seeing new pt but wants her to see him at least one more time, states she only wants to see a dr. So will have to go somewhere else if a dr can not see her not an NP. Pls fu with pt for sch or not

## 2021-07-06 NOTE — Telephone Encounter (Signed)
Appointment scheduled with Dr. Caryn Section tomorrow 07/07/2021 at 9:40am. Patient understands that Dr. Caryn Section is not taking on any new patients. She is currently working on finding a new PCP that's an MD.

## 2021-07-07 ENCOUNTER — Encounter: Payer: Self-pay | Admitting: Family Medicine

## 2021-07-07 ENCOUNTER — Ambulatory Visit (INDEPENDENT_AMBULATORY_CARE_PROVIDER_SITE_OTHER): Payer: 59 | Admitting: Family Medicine

## 2021-07-07 ENCOUNTER — Other Ambulatory Visit: Payer: Self-pay

## 2021-07-07 VITALS — BP 111/76 | HR 105 | Temp 98.1°F | Resp 16 | Wt 131.6 lb

## 2021-07-07 DIAGNOSIS — M779 Enthesopathy, unspecified: Secondary | ICD-10-CM

## 2021-07-07 NOTE — Patient Instructions (Signed)
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   If still not better within 3 weeks then call for a referral to physical therapy.

## 2021-07-07 NOTE — Progress Notes (Signed)
      Established patient visit   Patient: Robin Griffin   DOB: [birth year]   30 y.o. Female  MRN: [id number] Visit Date: 07/07/2021  Today's healthcare provider: Lelon Huh, MD   Chief Complaint  Patient presents with   Arm Pain   Subjective    Arm Pain  The incident occurred more than 1 week ago (several months ago). There was no injury mechanism. The pain is present in the right forearm and left forearm (lower arms). Radiates to: radiates into elbow. The pain has been Intermittent since the incident. The symptoms are aggravated by movement. She has tried nothing for the symptoms.   Patient thinks the pain could be caused by the way she holds her 51 month old infant. 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      Medications: Outpatient Medications Prior to Visit  Medication Sig   levothyroxine (SYNTHROID) 25 MCG tablet Take 25 mcg by mouth every morning.   [DISCONTINUED] Prenatal Vit-Fe Fumarate-FA (NAT-RUL PRENATAL VITAMINS PO) Take by mouth. (Patient not taking: Reported on 07/07/2021)   No facility-administered medications prior to visit.    Review of Systems  Constitutional:  Negative for appetite change, chills, fatigue and fever.  Respiratory:  Negative for chest tightness and shortness of breath.   Cardiovascular:  Negative for palpitations.  Gastrointestinal:  Negative for abdominal pain, nausea and vomiting.  Musculoskeletal:  Positive for arthralgias (hand pain) and myalgias (lower arms).  Neurological:  Negative for dizziness and weakness.      Objective    BP 111/76 (BP Location: Left Arm, Patient Position: Sitting, Cuff Size: Normal)   Pulse (!) 105   Temp 98.1 F (36.7 C) (Temporal)   Resp 16   Wt 131 lb 9.6 oz (59.7 kg)   LMP 06/06/2021   Breastfeeding Yes   BMI 24.07 kg/m     Physical Exam  Tender origin and insertion of right forearm extenders. Slight swelling proximally. Pain reproduced by right wrist and elbow extension.  No erythema. No tenderness of carpal tunnel. Negative tinels and phalens signs.    Assessment & Plan     1. Tendonitis Secondary to repetitive motion of computer work and aggravated by carrying her baby. Discussed OTC diclofenac gel, frequent icing, can try elbow brace. Call for OT if not improving over the next few weeks. No follow-ups on file.      The entirety of the information documented in the History of Present Illness, Review of Systems and Physical Exam were personally obtained by me. Portions of this information were initially documented by the CMA and reviewed by me for thoroughness and accuracy.     Lelon Huh, MD  [facility] [phone] (phone) [phone] (fax)  [place]

## 2021-08-06 ENCOUNTER — Telehealth: Payer: Self-pay

## 2021-08-06 NOTE — Telephone Encounter (Signed)
Copied from [place] [id number]. Topic: Appointment Scheduling - Scheduling Inquiry for Clinic >> Aug 06, 2021 11:35 AM Erick Blinks wrote: Reason for CRM: Pt called and is requesting to be seen urgently. She has neck and back pain on the right side she says, for about a week now. She declined appt offers and is requesting to speak to a nurse today for advice (potential Urgent care visit if recommended)   Best contact: [phone]

## 2021-08-06 NOTE — Telephone Encounter (Signed)
Apt with Simona Huh 08/09/2021 at 3:20

## 2021-08-09 ENCOUNTER — Encounter: Payer: Self-pay | Admitting: Family Medicine

## 2021-08-09 ENCOUNTER — Other Ambulatory Visit: Payer: Self-pay

## 2021-08-09 ENCOUNTER — Ambulatory Visit (INDEPENDENT_AMBULATORY_CARE_PROVIDER_SITE_OTHER): Payer: Self-pay | Admitting: Family Medicine

## 2021-08-09 NOTE — Progress Notes (Signed)
Established patient visit   Patient: Robin Griffin   DOB: 1991-08-14   30 y.o. Female  MRN: [id number] Visit Date: 08/09/2021  Today's healthcare provider: Vernie Murders, PA-C   No chief complaint on file.  Subjective    HPI  Paint reports discomfort in ear and down to jaw bone. Feels heavy and like its pulling. Patient reports it has been giving her discomfort for about 2 months now compares it to ears popping if you were traveling upwards (I.e. On airplane going up) When touching under chin something feels odd but she can't really describe. When swallowing feels like something is there, she can be laying down and swallows and feels it or even just sitting down. Can be swallowing saliva does not have to be food. When touching right side she reports feeling pain as well as when she bends over she has low back pain. -----------------------------------------------------------------------------------------   Past Medical History:  Diagnosis Date   Dermoid cyst of left ovary 08/16/2016   Hypothyroidism    Seizures ([place])    childhood with fever   Past Surgical History:  Procedure Laterality Date   LAPAROSCOPIC OVARIAN CYSTECTOMY Left 08/16/2016   Procedure: LAPAROSCOPIC OVARIAN CYSTECTOMY;  Surgeon: Azucena Fallen, MD;  Location: [facility];  Service: Gynecology;  Laterality: Left;   Family History  Problem Relation Age of Onset   Asthma Maternal Uncle    Cancer Maternal Uncle        lung   Social History   Tobacco Use   Smoking status: Never   Smokeless tobacco: Never  Vaping Use   Vaping Use: Never used  Substance Use Topics   Alcohol use: No   Drug use: No   No Known Allergies   Medications: Outpatient Medications Prior to Visit  Medication Sig   levothyroxine (SYNTHROID) 25 MCG tablet Take 25 mcg by mouth every morning.   No facility-administered medications prior to visit.    Review of Systems  HENT:  Positive for ear pain and trouble swallowing.     Last CBC Lab Results  Component Value Date   WBC 7.9 11/23/2020   HGB 13.2 11/23/2020   HCT 40.5 11/23/2020   MCV 87 11/23/2020   MCH 28.3 11/23/2020   RDW 11.9 11/23/2020   PLT 324 [id number]   Last metabolic panel Lab Results  Component Value Date   GLUCOSE 72 11/23/2020   NA 139 11/23/2020   K 4.8 11/23/2020   CL 101 11/23/2020   CO2 23 11/23/2020   BUN 10 11/23/2020   CREATININE 0.67 11/23/2020   GFRNONAA 118 11/23/2020   GFRAA 135 11/23/2020   CALCIUM 10.0 11/23/2020   PROT 7.2 01/29/2021   ALBUMIN 4.6 01/29/2021   LABGLOB 2.7 11/23/2020   AGRATIO 1.6 11/23/2020   BILITOT 0.3 01/29/2021   ALKPHOS 178 (H) 01/29/2021   AST 66 (H) 01/29/2021   ALT 136 (H) 01/29/2021   Last lipids Lab Results  Component Value Date   CHOL 227 (H) 11/23/2020   HDL 61 11/23/2020   LDLCALC 154 (H) 11/23/2020   TRIG 68 11/23/2020   CHOLHDL 3.7 11/23/2020   Last hemoglobin A1c Lab Results  Component Value Date   HGBA1C 5.1 11/27/2019   Last thyroid functions Lab Results  Component Value Date   TSH 0.822 11/23/2020   Last vitamin D No results found for: 25OHVITD2, 25OHVITD3, VD25OH Last vitamin B12 and Folate No results found for: VITAMINB12, FOLATE     Objective    There  were no vitals taken for this visit. BP Readings from Last 3 Encounters:  08/09/21 116/83  07/07/21 111/76  11/23/20 110/75   Wt Readings from Last 3 Encounters:  08/09/21 126 lb 9.6 oz (57.4 kg)  07/07/21 131 lb 9.6 oz (59.7 kg)  11/23/20 146 lb 6.4 oz (66.4 kg)   Physical Exam    No results found for any visits on 08/09/21.  Assessment & Plan     .Addendum: I reviewed unsigned encounter notes after the retirement of [employer]. There is no evaluation or management documented. No charge was submitted for this encounter.   Birdie Sons, MD     Vernie Murders, PA-C  [facility] [phone] (phone) [phone] (fax)  [place]

## 2021-08-10 ENCOUNTER — Telehealth: Payer: Self-pay

## 2021-08-10 NOTE — Telephone Encounter (Signed)
Copied from [place] [id number]. Topic: General - Other >> Aug 10, 2021  2:49 PM Bayard Beaver wrote: Reason for [id number] called with id# [id number] and group # [phone] for insurance with [facility]

## 2021-08-10 NOTE — Telephone Encounter (Signed)
Copied from [place] [id number]. Topic: General - Other >> Aug 10, 2021  1:00 PM Robin Griffin wrote: Reason for CRM: Pt called to give her updated insurance info/ [facility] / Member ID# [id number] Group# [id number] please advise if this will cover her recent appt when ran

## 2021-09-16 ENCOUNTER — Other Ambulatory Visit: Payer: Self-pay

## 2021-09-16 ENCOUNTER — Encounter: Payer: Self-pay | Admitting: Family Medicine

## 2021-09-16 ENCOUNTER — Ambulatory Visit (INDEPENDENT_AMBULATORY_CARE_PROVIDER_SITE_OTHER): Payer: 59 | Admitting: Family Medicine

## 2021-09-16 VITALS — BP 98/68 | HR 105 | Temp 98.1°F | Resp 16 | Ht 62.0 in | Wt 130.9 lb

## 2021-09-16 DIAGNOSIS — E042 Nontoxic multinodular goiter: Secondary | ICD-10-CM

## 2021-09-16 DIAGNOSIS — E039 Hypothyroidism, unspecified: Secondary | ICD-10-CM

## 2021-09-16 DIAGNOSIS — J301 Allergic rhinitis due to pollen: Secondary | ICD-10-CM

## 2021-09-16 DIAGNOSIS — R7989 Other specified abnormal findings of blood chemistry: Secondary | ICD-10-CM | POA: Diagnosis not present

## 2021-09-16 DIAGNOSIS — R221 Localized swelling, mass and lump, neck: Secondary | ICD-10-CM | POA: Diagnosis not present

## 2021-09-16 DIAGNOSIS — M62838 Other muscle spasm: Secondary | ICD-10-CM

## 2021-09-16 NOTE — Patient Instructions (Signed)
Zyrtec or claritin at bedtime Humidifier running at night Nasal saline 2-3 times daily  They will call to schedule the [place]

## 2021-09-16 NOTE — Progress Notes (Signed)
Established patient visit   Patient: Robin Griffin   DOB: July 24, 1991   30 y.o. Female  MRN: [id number] Visit Date: 09/16/2021  Today's healthcare provider: Lavon Paganini, MD   Chief Complaint  Patient presents with   Follow-up   Subjective    HPI  Follow up for enlarged thyroid  The patient was last seen for this 7 months ago. Changes made at last visit include no changes.  She reports excellent compliance with treatment. She feels that condition is Unchanged. She is not having side effects.   -----------------------------------------------------------------------------------------  Patient C/O neck and throat pain on and off times several months. Patient reports pain is mostly in the mornings. Patient reports no medication is needed for pain. Patient reports seeing blood mostly from one nostril at a time on tissue, not dripping out. Patient reports "burning" sensation in the throat and nostril.  Last 2 days for the nose.  Shoulder and upper back and neck stiffness and muscle tightness. Sometimes muscle tenderness.  Started after baby x12yr. Worse with feeding him.     Medications: Outpatient Medications Prior to Visit  Medication Sig   levothyroxine (SYNTHROID) 25 MCG tablet Take 25 mcg by mouth every morning.   No facility-administered medications prior to visit.    Review of Systems  Constitutional:  Positive for unexpected weight change. Negative for appetite change and fatigue.  HENT:  Positive for sore throat.   Cardiovascular:  Positive for palpitations. Negative for chest pain.  Gastrointestinal:  Positive for nausea. Negative for constipation, diarrhea and vomiting.  Endocrine: Negative for cold intolerance and heat intolerance.  Musculoskeletal:  Positive for arthralgias, back pain, myalgias, neck pain and neck stiffness.  Neurological:  Positive for numbness.      Objective    BP 98/68 (BP Location: Left Arm, Patient Position: Sitting, Cuff  Size: Normal)   Pulse (!) 105   Temp 98.1 F (36.7 C) (Temporal)   Resp 16   Ht 5\' 2"  (1.575 m)   Wt 130 lb 14.4 oz (59.4 kg)   LMP 08/17/2021 (Exact Date)   SpO2 99%   Breastfeeding Yes   BMI 23.94 kg/m  {Show previous vital signs (optional):23777}  Physical Exam Vitals reviewed.  Constitutional:      General: She is not in acute distress.    Appearance: Normal appearance. She is well-developed. She is not diaphoretic.  HENT:     Head: Normocephalic and atraumatic.  Eyes:     General: No scleral icterus.    Conjunctiva/sclera: Conjunctivae normal.     Pupils: Pupils are equal, round, and reactive to light.  Neck:     Thyroid: Thyromegaly present.     Comments: Small 0.5 cm nodule submental nontender and nonmobile Cardiovascular:     Rate and Rhythm: Normal rate and regular rhythm.     Pulses: Normal pulses.     Heart sounds: Normal heart sounds. No murmur heard. Pulmonary:     Effort: Pulmonary effort is normal. No respiratory distress.     Breath sounds: Normal breath sounds. No wheezing, rhonchi or rales.  Musculoskeletal:     Cervical back: Neck supple.     Right lower leg: No edema.     Left lower leg: No edema.     Comments: Tightness of bilateral trapezius muscles  Lymphadenopathy:     Cervical: No cervical adenopathy.  Skin:    General: Skin is warm and dry.     Findings: No rash.  Neurological:  Mental Status: She is alert and oriented to person, place, and time. Mental status is at baseline.  Psychiatric:        Mood and Affect: Mood normal.        Behavior: Behavior normal.        Thought Content: Thought content normal.      No results found for any visits on 09/16/21.  Assessment & Plan     Problem List Items Addressed This Visit       Respiratory   Seasonal allergic rhinitis due to pollen    Discussed that her nasal congestion and morning sore throat are likely related to postnasal drip Encourage Claritin or Zyrtec and  Flonase Encourage humidifier in her room at night        Endocrine   Hypothyroidism    Followed by endocrinology No changes today      Multinodular goiter    Followed by endocrinology Reviewed last thyroid ultrasound Needs repeat ultrasound in 2023, but this is managed by endocrinology        Musculoskeletal and Integument   Trapezius muscle spasm    Likely related to positioning while breast-feeding her baby Encouraged heat/ice, stretches, NSAIDs as needed        Other   Neck mass - Primary    New Noted incidentally on exam It is midline and small, so some concern for possible congenital thyroglossal duct cyst versus shotty lymph node Ultrasound ordered to evaluate further      Relevant Orders   US Soft Tissue Head/Neck (NON-THYROID)   Elevated LFTs    Were persistently elevated, but then had GI work-up with normalization of her LFTs Should be repeated at her upcoming CPE        Return in about 2 months (around 11/16/2021) for CPE, With new PCP.      Total time spent on today's visit was greater than 40 minutes, including both face-to-face time and nonface-to-face time personally spent on review of chart (labs and imaging), discussing labs and goals, discussing further work-up, treatment options, referrals to specialist if needed, reviewing outside records of pertinent, answering patient's questions, and coordinating care.   I, Lavon Paganini, MD, have reviewed all documentation for this visit. The documentation on 09/17/21 for the exam, diagnosis, procedures, and orders are all accurate and complete.   [place] Current, Dionne Bucy, MD, MPH [facility]

## 2021-09-17 DIAGNOSIS — M62838 Other muscle spasm: Secondary | ICD-10-CM | POA: Insufficient documentation

## 2021-09-17 DIAGNOSIS — J301 Allergic rhinitis due to pollen: Secondary | ICD-10-CM | POA: Insufficient documentation

## 2021-09-17 NOTE — Assessment & Plan Note (Signed)
Followed by endocrinology Reviewed last thyroid ultrasound Needs repeat ultrasound in 2023, but this is managed by endocrinology

## 2021-09-17 NOTE — Assessment & Plan Note (Signed)
Followed by endocrinology No changes today

## 2021-09-17 NOTE — Assessment & Plan Note (Signed)
Discussed that her nasal congestion and morning sore throat are likely related to postnasal drip Encourage Claritin or Zyrtec and Flonase Encourage humidifier in her room at night

## 2021-09-17 NOTE — Assessment & Plan Note (Signed)
Likely related to positioning while breast-feeding her baby Encouraged heat/ice, stretches, NSAIDs as needed

## 2021-09-17 NOTE — Assessment & Plan Note (Signed)
Were persistently elevated, but then had GI work-up with normalization of her LFTs Should be repeated at her upcoming CPE

## 2021-09-17 NOTE — Assessment & Plan Note (Signed)
New Noted incidentally on exam It is midline and small, so some concern for possible congenital thyroglossal duct cyst versus shotty lymph node Ultrasound ordered to evaluate further

## 2021-09-27 ENCOUNTER — Ambulatory Visit
Admission: RE | Admit: 2021-09-27 | Discharge: 2021-09-27 | Disposition: A | Discharge status: Home / Self Care | Payer: 59 | Source: Ambulatory Visit | Attending: Family Medicine | Admitting: Family Medicine

## 2021-09-27 ENCOUNTER — Other Ambulatory Visit: Payer: Self-pay

## 2021-09-27 DIAGNOSIS — R221 Localized swelling, mass and lump, neck: Secondary | ICD-10-CM | POA: Diagnosis present

## 2021-10-14 ENCOUNTER — Ambulatory Visit: Payer: 59 | Admitting: Family Medicine

## 2021-11-24 ENCOUNTER — Other Ambulatory Visit: Payer: Self-pay

## 2021-11-24 ENCOUNTER — Ambulatory Visit (INDEPENDENT_AMBULATORY_CARE_PROVIDER_SITE_OTHER): Payer: 59 | Admitting: Family Medicine

## 2021-11-24 ENCOUNTER — Encounter: Payer: Self-pay | Admitting: Family Medicine

## 2021-11-24 VITALS — BP 117/74 | HR 98 | Temp 98.1°F | Resp 16 | Ht 62.0 in | Wt 128.6 lb

## 2021-11-24 DIAGNOSIS — E039 Hypothyroidism, unspecified: Secondary | ICD-10-CM | POA: Diagnosis not present

## 2021-11-24 DIAGNOSIS — E7800 Pure hypercholesterolemia, unspecified: Secondary | ICD-10-CM | POA: Diagnosis not present

## 2021-11-24 DIAGNOSIS — Z0000 Encounter for general adult medical examination without abnormal findings: Secondary | ICD-10-CM | POA: Diagnosis not present

## 2021-11-24 DIAGNOSIS — R7989 Other specified abnormal findings of blood chemistry: Secondary | ICD-10-CM

## 2021-11-24 DIAGNOSIS — R21 Rash and other nonspecific skin eruption: Secondary | ICD-10-CM

## 2021-11-24 MED ORDER — TRIAMCINOLONE ACETONIDE 0.5 % EX OINT: 1.0000 "application " | TOPICAL_OINTMENT | Freq: Two times a day (BID) | CUTANEOUS | 0 refills | Status: DC | PRN

## 2021-11-24 NOTE — Assessment & Plan Note (Signed)
We recommend diet low in saturated fat and regular exercise - 30 min at least 5 times per week Repeat lipid panel

## 2021-11-24 NOTE — Progress Notes (Signed)
Complete physical exam   Patient: Robin Griffin   DOB: 18-Aug-1991   30 y.o. Female  MRN: [id number] Visit Date: 11/24/2021  Today's healthcare provider: Gwyneth Sprout, FNP   Chief Complaint  Patient presents with   Annual Exam   I,Sulibeya S Dimas,acting as a scribe for Gwyneth Sprout, FNP.,have documented all relevant documentation on the behalf of Gwyneth Sprout, FNP,as directed by  Gwyneth Sprout, FNP while in the presence of Gwyneth Sprout, FNP.  Subjective    Robin Griffin is a 30 y.o. female who presents today for a complete physical exam.  She reports consuming a general diet. The patient does not participate in regular exercise at present. She generally feels well. She reports sleeping well. She does not have additional problems to discuss today.  HPI    Past Medical History:  Diagnosis Date   Dermoid cyst of left ovary 08/16/2016   Hypothyroidism    Seizures (HCC)    childhood with fever   Past Surgical History:  Procedure Laterality Date   LAPAROSCOPIC OVARIAN CYSTECTOMY Left 08/16/2016   Procedure: LAPAROSCOPIC OVARIAN CYSTECTOMY;  Surgeon: Azucena Fallen, MD;  Location: [facility];  Service: Gynecology;  Laterality: Left;   Social History   Socioeconomic History   Marital status: Married    Spouse name: Not on file   Number of children: Not on file   Years of education: Not on file   Highest education level: Not on file  Occupational History   Not on file  Tobacco Use   Smoking status: Never   Smokeless tobacco: Never  Vaping Use   Vaping Use: Never used  Substance and Sexual Activity   Alcohol use: No   Drug use: No   Sexual activity: Yes  Other Topics Concern   Not on file  Social History Narrative   Not on file   Social Determinants of Health   Financial Resource Strain: Not on file  Food Insecurity: Not on file  Transportation Needs: Not on file  Physical Activity: Not on file  Stress: Not on file  Social Connections: Not on file   Intimate Partner Violence: Not on file   Family Status  Relation Name Status   Mother  Alive   Father  Alive   Sister  Alive   Brother  Alive   Mat Uncle  Deceased   Family History  Problem Relation Age of Onset   Asthma Maternal Uncle    Cancer Maternal Uncle        lung   No Known Allergies  Patient Care Team: Gwyneth Sprout, FNP as PCP - General (Family Medicine)   Medications: Outpatient Medications Prior to Visit  Medication Sig   levothyroxine (SYNTHROID) 25 MCG tablet Take 25 mcg by mouth every morning.   No facility-administered medications prior to visit.    Review of Systems  Constitutional: Negative.   HENT: Negative.    Eyes: Negative.   Respiratory: Negative.    Cardiovascular: Negative.   Gastrointestinal: Negative.   Endocrine: Negative.   Genitourinary: Negative.   Musculoskeletal:  Positive for back pain, myalgias, neck pain and neck stiffness.  Skin: Negative.   Allergic/Immunologic: Negative.   Neurological:  Positive for light-headedness and headaches.  Hematological: Negative.   Psychiatric/Behavioral: Negative.     Last CBC Lab Results  Component Value Date   WBC 7.9 11/23/2020   HGB 13.2 11/23/2020   HCT 40.5 11/23/2020   MCV 87 11/23/2020  MCH 28.3 11/23/2020   RDW 11.9 11/23/2020   PLT 324 81/82/9937   Last metabolic panel Lab Results  Component Value Date   GLUCOSE 72 11/23/2020   NA 139 11/23/2020   K 4.8 11/23/2020   CL 101 11/23/2020   CO2 23 11/23/2020   BUN 10 11/23/2020   CREATININE 0.67 11/23/2020   GFRNONAA 118 11/23/2020   CALCIUM 10.0 11/23/2020   PROT 7.2 01/29/2021   ALBUMIN 4.6 01/29/2021   LABGLOB 2.7 11/23/2020   AGRATIO 1.6 11/23/2020   BILITOT 0.3 01/29/2021   ALKPHOS 178 (H) 01/29/2021   AST 66 (H) 01/29/2021   ALT 136 (H) 01/29/2021   Last lipids Lab Results  Component Value Date   CHOL 227 (H) 11/23/2020   HDL 61 11/23/2020   LDLCALC 154 (H) 11/23/2020   TRIG 68 11/23/2020   CHOLHDL 3.7  11/23/2020   Last thyroid functions Lab Results  Component Value Date   TSH 0.822 11/23/2020      Objective    BP 117/74 (BP Location: Right Arm, Patient Position: Sitting, Cuff Size: Normal)    Pulse 98    Temp 98.1 F (36.7 C) (Oral)    Resp 16    Ht 5\' 2"  (1.575 m)    Wt 128 lb 9.6 oz (58.3 kg)    LMP 11/16/2021 (Exact Date)    SpO2 100%    BMI 23.52 kg/m  BP Readings from Last 3 Encounters:  11/24/21 117/74  09/16/21 98/68  08/09/21 116/83   Wt Readings from Last 3 Encounters:  11/24/21 128 lb 9.6 oz (58.3 kg)  09/16/21 130 lb 14.4 oz (59.4 kg)  08/09/21 126 lb 9.6 oz (57.4 kg)      Physical Exam Vitals and nursing note reviewed.  Constitutional:      General: She is awake. She is not in acute distress.    Appearance: Normal appearance. She is well-developed, well-groomed and normal weight. She is not ill-appearing, toxic-appearing or diaphoretic.  HENT:     Head: Normocephalic and atraumatic.     Jaw: There is normal jaw occlusion. No trismus, tenderness, swelling or pain on movement.     Right Ear: Hearing, tympanic membrane, ear canal and external ear normal. There is no impacted cerumen.     Left Ear: Hearing, tympanic membrane, ear canal and external ear normal. There is no impacted cerumen.     Nose: Nose normal. No congestion or rhinorrhea.     Right Turbinates: Not enlarged, swollen or pale.     Left Turbinates: Not enlarged, swollen or pale.     Right Sinus: No maxillary sinus tenderness or frontal sinus tenderness.     Left Sinus: No maxillary sinus tenderness or frontal sinus tenderness.     Mouth/Throat:     Lips: Pink.     Mouth: Mucous membranes are moist. No injury.     Tongue: No lesions.     Pharynx: Oropharynx is clear. Uvula midline. No pharyngeal swelling, oropharyngeal exudate, posterior oropharyngeal erythema or uvula swelling.     Tonsils: No tonsillar exudate or tonsillar abscesses.  Eyes:     General: Lids are normal. Lids are everted, no  foreign bodies appreciated. Vision grossly intact. Gaze aligned appropriately. No allergic shiner or visual field deficit.       Right eye: No discharge.        Left eye: No discharge.     Extraocular Movements: Extraocular movements intact.     Conjunctiva/sclera: Conjunctivae normal.  Right eye: Right conjunctiva is not injected. No exudate.    Left eye: Left conjunctiva is not injected. No exudate.    Pupils: Pupils are equal, round, and reactive to light.  Neck:     Thyroid: No thyroid mass, thyromegaly or thyroid tenderness.     Vascular: No carotid bruit.     Trachea: Trachea normal.      Comments: Encouraged to try stretching exercises for neck given computer work and breast feeding; likely MSK given postural demands of each Cardiovascular:     Rate and Rhythm: Normal rate and regular rhythm.     Pulses: Normal pulses.          Carotid pulses are 2+ on the right side and 2+ on the left side.      Radial pulses are 2+ on the right side and 2+ on the left side.       Dorsalis pedis pulses are 2+ on the right side and 2+ on the left side.       Posterior tibial pulses are 2+ on the right side and 2+ on the left side.     Heart sounds: Normal heart sounds, S1 normal and S2 normal. No murmur heard.   No friction rub. No gallop.  Pulmonary:     Effort: Pulmonary effort is normal. No respiratory distress.     Breath sounds: Normal breath sounds and air entry. No stridor. No wheezing, rhonchi or rales.  Chest:     Chest wall: No tenderness.     Comments: Breasts: breasts appear normal, no suspicious masses, no skin or nipple changes or axillary nodes, symmetric fibrous changes in both upper outer quadrants, right breast normal without mass, skin or nipple changes or axillary nodes, left breast normal without mass, skin or nipple changes or axillary nodes, lactating, no erythema or tenderness, nipples normal, risk and benefit of breast self-exam was discussed  Abdominal:     General:  Abdomen is flat. Bowel sounds are normal. There is no distension.     Palpations: Abdomen is soft. There is no mass.     Tenderness: There is no abdominal tenderness. There is no right CVA tenderness, left CVA tenderness, guarding or rebound.     Hernia: No hernia is present.  Genitourinary:    Comments: Exam deferred; denies complaints Musculoskeletal:        General: No swelling, tenderness, deformity or signs of injury. Normal range of motion.     Cervical back: Full passive range of motion without pain, normal range of motion and neck supple. No edema, rigidity or tenderness. No muscular tenderness.     Right lower leg: No edema.     Left lower leg: No edema.  Lymphadenopathy:     Cervical: No cervical adenopathy.     Right cervical: No superficial, deep or posterior cervical adenopathy.    Left cervical: No superficial, deep or posterior cervical adenopathy.  Skin:    General: Skin is warm and dry.     Capillary Refill: Capillary refill takes less than 2 seconds.     Coloration: Skin is not jaundiced or pale.     Findings: Rash present. No bruising, erythema or lesion.  Neurological:     General: No focal deficit present.     Mental Status: She is alert and oriented to person, place, and time. Mental status is at baseline.     GCS: GCS eye subscore is 4. GCS verbal subscore is 5. GCS motor subscore is 6.  Sensory: Sensation is intact. No sensory deficit.     Motor: Motor function is intact. No weakness.     Coordination: Coordination is intact. Coordination normal.     Gait: Gait is intact. Gait normal.  Psychiatric:        Attention and Perception: Attention and perception normal.        Mood and Affect: Mood and affect normal.        Speech: Speech normal.        Behavior: Behavior normal. Behavior is cooperative.        Thought Content: Thought content normal.        Cognition and Memory: Cognition and memory normal.        Judgment: Judgment normal.      Last  depression screening scores PHQ 2/9 Scores 11/24/2021 09/16/2021 11/23/2020  PHQ - 2 Score 0 0 0  PHQ- 9 Score 0 0 -   Last fall risk screening Fall Risk  11/24/2021  Falls in the past year? 0  Number falls in past yr: 0  Injury with Fall? 0  Risk for fall due to : No Fall Risks  Follow up Falls evaluation completed   Last Audit-C alcohol use screening Alcohol Use Disorder Test (AUDIT) 11/24/2021  1. How often do you have a drink containing alcohol? 0  2. How many drinks containing alcohol do you have on a typical day when you are drinking? 0  3. How often do you have six or more drinks on one occasion? 0  AUDIT-C Score 0  Alcohol Brief Interventions/Follow-up -   A score of 3 or more in women, and 4 or more in men indicates increased risk for alcohol abuse, EXCEPT if all of the points are from question 1   No results found for any visits on 11/24/21.  Assessment & Plan    Routine Health Maintenance and Physical Exam  Exercise Activities and Dietary recommendations  Goals   None     Immunization History  Administered Date(s) Administered   Influenza,inj,Quad PF,6+ Mos 11/03/2016   Influenza-Unspecified 10/06/2015, 09/20/2017   PFIZER(Purple Top)SARS-COV-2 Vaccination 03/13/2020, 04/04/2020    Health Maintenance  Topic Date Due   TETANUS/TDAP  Never done   COVID-19 Vaccine (3 - Booster for Pfizer series) 05/30/2020   INFLUENZA VACCINE  02/25/2022 (Originally 06/28/2021)   PAP SMEAR-Modifier  01/20/2023   Hepatitis C Screening  Completed   HIV Screening  Completed   Pneumococcal Vaccine 54-45 Years old  Aged Out   HPV VACCINES  Aged Out    Discussed health benefits of physical activity, and encouraged her to engage in regular exercise appropriate for her age and condition.  Problem List Items Addressed This Visit       Endocrine   Hypothyroidism    Followed by endocrinology Repeat blood work today      Relevant Orders   TSH + free T4     Musculoskeletal  and Integument   Rash of neck    Small irritation near base of L ear; trial of kenalog      Relevant Medications   triamcinolone ointment (KENALOG) 0.5 %     Other   Elevated LFTs    Denies ETOH or use of NSAIDs Repeat labs      Relevant Orders   Comprehensive metabolic panel   Elevated LDL cholesterol level    We recommend diet low in saturated fat and regular exercise - 30 min at least 5 times per week Repeat lipid  panel       Relevant Orders   Lipid Panel With LDL/HDL Ratio   Annual physical exam - Primary    Reports stress r/t little one at home, working at home and no additional childcare Things to do to keep yourself healthy  - Exercise at least 30-45 minutes a day, 3-4 days a week.  - Eat a low-fat diet with lots of fruits and vegetables, up to 7-9 servings per day.  - Seatbelts can save your life. Wear them always.  - Smoke detectors on every level of your home, check batteries every year.  - Eye Doctor - have an eye exam every 1-2 years  - Safe sex - if you may be exposed to STDs, use a condom.  - Alcohol -  If you drink, do it moderately, less than 2 drinks per day.  - [place]. Choose someone to speak for you if you are not able.  - Depression is common in our stressful world.If you're feeling down or losing interest in things you normally enjoy, please come in for a visit.  - Violence - If anyone is threatening or hurting you, please call immediately.          Return in about 1 year (around 11/24/2022) for annual examination.     Vonna Kotyk, FNP, have reviewed all documentation for this visit. The documentation on 11/24/21 for the exam, diagnosis, procedures, and orders are all accurate and complete.    Gwyneth Sprout, [place] [phone] (phone) [phone] (fax)  [place]

## 2021-11-24 NOTE — Assessment & Plan Note (Signed)
Reports stress r/t little one at home, working at home and no additional childcare Things to do to keep yourself healthy  - Exercise at least 30-45 minutes a day, 3-4 days a week.  - Eat a low-fat diet with lots of fruits and vegetables, up to 7-9 servings per day.  - Seatbelts can save your life. Wear them always.  - Smoke detectors on every level of your home, check batteries every year.  - Eye Doctor - have an eye exam every 1-2 years  - Safe sex - if you may be exposed to STDs, use a condom.  - Alcohol -  If you drink, do it moderately, less than 2 drinks per day.  - [place]. Choose someone to speak for you if you are not able.  - Depression is common in our stressful world.If you're feeling down or losing interest in things you normally enjoy, please come in for a visit.  - Violence - If anyone is threatening or hurting you, please call immediately.

## 2021-11-24 NOTE — Assessment & Plan Note (Signed)
Denies ETOH or use of NSAIDs Repeat labs

## 2021-11-24 NOTE — Assessment & Plan Note (Signed)
Followed by endocrinology Repeat blood work today

## 2021-11-24 NOTE — Assessment & Plan Note (Signed)
Small irritation near base of L ear; trial of kenalog

## 2021-11-25 LAB — COMPREHENSIVE METABOLIC PANEL
ALT: 45 IU/L — ABNORMAL HIGH (ref 0–32)
AST: 34 IU/L (ref 0–40)
Albumin/Globulin Ratio: 1.7 (ref 1.2–2.2)
Albumin: 4.5 g/dL (ref 3.8–4.8)
Alkaline Phosphatase: 112 IU/L (ref 44–121)
BUN/Creatinine Ratio: 13 (ref 9–23)
BUN: 9 mg/dL (ref 6–20)
Bilirubin Total: 0.3 mg/dL (ref 0.0–1.2)
CO2: 21 mmol/L (ref 20–29)
Calcium: 8.8 mg/dL (ref 8.7–10.2)
Chloride: 102 mmol/L (ref 96–106)
Creatinine, Ser: 0.72 mg/dL (ref 0.57–1.00)
Globulin, Total: 2.7 g/dL (ref 1.5–4.5)
Glucose: 78 mg/dL (ref 70–99)
Potassium: 3.9 mmol/L (ref 3.5–5.2)
Sodium: 139 mmol/L (ref 134–144)
Total Protein: 7.2 g/dL (ref 6.0–8.5)
eGFR: 114 mL/min/{1.73_m2} (ref >59)

## 2021-11-25 LAB — LIPID PANEL WITH LDL/HDL RATIO
Cholesterol, Total: 201 mg/dL — ABNORMAL HIGH (ref 100–199)
HDL: 58 mg/dL (ref >39)
LDL Chol Calc (NIH): 130 mg/dL — ABNORMAL HIGH (ref 0–99)
LDL/HDL Ratio: 2.2 ratio (ref 0.0–3.2)
Triglycerides: 73 mg/dL (ref 0–149)
VLDL Cholesterol Cal: 13 mg/dL (ref 5–40)

## 2021-11-25 LAB — TSH+FREE T4
Free T4: 1.32 ng/dL (ref 0.82–1.77)
TSH: 0.878 u[IU]/mL (ref 0.450–4.500)

## 2021-12-20 ENCOUNTER — Ambulatory Visit: Payer: Self-pay

## 2021-12-20 NOTE — Progress Notes (Signed)
Acute Office Visit  Subjective:    Patient ID: Robin Griffin, female    DOB: 09-22-1991, 31 y.o.   MRN: [id number]  No chief complaint on file.  Today's Provider: Talitha Givens, MHS, PA-C Introduced myself to the patient as a PA-C and provided education on APPs in clinical practice.    HPI Patient is in today for evaluation of post Covid symptoms.  Patient states she tested positive for Covid on 12/15/21.  At that time she had headache, ear pain, fever, change in taste and some cough.   She took Tylenol and Ibuprofen for her symptoms.   She states she is feeling some better but she still has some cough, and fatigue, but mainly headache and right ear pain. She has been fever free for greater than 24 hours without the use of medication.  States she is having intermittent right neck and head pain  Reports it is very frequent and avg pain level of 3/10  She is not taking any medication for this at this time.  Denies photophobia, phonophobia, rhinorrhea, tearing, vision changes Denies changes to intensity with physical activity.      Past Medical History:  Diagnosis Date   Dermoid cyst of left ovary 08/16/2016   Hypothyroidism    Seizures ([place])    childhood with fever    Past Surgical History:  Procedure Laterality Date   LAPAROSCOPIC OVARIAN CYSTECTOMY Left 08/16/2016   Procedure: LAPAROSCOPIC OVARIAN CYSTECTOMY;  Surgeon: Azucena Fallen, MD;  Location: [facility];  Service: Gynecology;  Laterality: Left;    Family History  Problem Relation Age of Onset   Asthma Maternal Uncle    Cancer Maternal Uncle        lung    Social History   Socioeconomic History   Marital status: Married    Spouse name: Not on file   Number of children: Not on file   Years of education: Not on file   Highest education level: Not on file  Occupational History   Not on file  Tobacco Use   Smoking status: Never   Smokeless tobacco: Never  Vaping Use   Vaping Use: Never used  Substance and  Sexual Activity   Alcohol use: No   Drug use: No   Sexual activity: Yes  Other Topics Concern   Not on file  Social History Narrative   Not on file   Social Determinants of Health   Financial Resource Strain: Not on file  Food Insecurity: Not on file  Transportation Needs: Not on file  Physical Activity: Not on file  Stress: Not on file  Social Connections: Not on file  Intimate Partner Violence: Not on file    Outpatient Medications Prior to Visit  Medication Sig Dispense Refill   levothyroxine (SYNTHROID) 25 MCG tablet Take 25 mcg by mouth every morning.     triamcinolone ointment (KENALOG) 0.5 % Apply 1 application topically 2 (two) times daily as needed. Pea size or smaller, discontinue use after 1 week. 30 g 0   No facility-administered medications prior to visit.    No Known Allergies  Review of Systems  Constitutional:  Positive for fatigue. Negative for fever.  HENT:  Positive for ear pain. Negative for congestion, ear discharge, postnasal drip, rhinorrhea, sore throat and tinnitus.   Eyes:  Negative for photophobia, discharge and visual disturbance.  Respiratory:  Positive for cough. Negative for shortness of breath and wheezing.   Cardiovascular:  Negative for chest pain.  Gastrointestinal:  Negative for  abdominal pain, constipation, diarrhea, nausea and vomiting.  Musculoskeletal:  Positive for myalgias and neck pain.  Neurological:  Positive for headaches. Negative for dizziness, weakness, light-headedness and numbness.      Objective:    Physical Exam Vitals reviewed.  Constitutional:      General: She is awake.     Appearance: Normal appearance. She is well-developed, well-groomed and normal weight.  HENT:     Head: Normocephalic and atraumatic.  Eyes:     General: Lids are normal.     Conjunctiva/sclera: Conjunctivae normal.     Pupils: Pupils are unequal.     Right eye: Pupil is sluggish.     Left eye: Pupil is not reactive.  Neck:     Trachea:  Trachea and phonation normal.     Meningeal: Brudzinski's sign and Kernig's sign absent.  Cardiovascular:     Rate and Rhythm: Regular rhythm. Tachycardia present.     Pulses: Normal pulses.     Heart sounds: Normal heart sounds.  Pulmonary:     Effort: Pulmonary effort is normal.     Breath sounds: Normal breath sounds and air entry. No decreased breath sounds, wheezing, rhonchi or rales.  Musculoskeletal:     Cervical back: Normal range of motion and neck supple.     Right lower leg: No edema.     Left lower leg: No edema.  Lymphadenopathy:     Head:     Right side of head: No submental or submandibular adenopathy.     Left side of head: No submental or submandibular adenopathy.     Upper Body:     Right upper body: No supraclavicular adenopathy.     Left upper body: No supraclavicular adenopathy.  Skin:    General: Skin is warm and dry.     Findings: No ecchymosis, erythema, lesion or rash.  Neurological:     General: No focal deficit present.     Mental Status: She is alert.     GCS: GCS eye subscore is 4. GCS verbal subscore is 5. GCS motor subscore is 6.     Cranial Nerves: Cranial nerves 2-12 are intact. No cranial nerve deficit, dysarthria or facial asymmetry.     Motor: Motor function is intact.     Coordination: Finger-Nose-Finger Test normal.     Gait: Gait is intact.     Deep Tendon Reflexes:     Reflex Scores:      Patellar reflexes are 0 on the right side and 0 on the left side. Psychiatric:        Attention and Perception: Attention and perception normal.        Mood and Affect: Mood is anxious.        Behavior: Behavior normal. Behavior is cooperative.    BP 103/69 (BP Location: Right Arm, Patient Position: Sitting, Cuff Size: Normal)    Pulse (!) 103    Temp 99 F (37.2 C) (Oral)    Wt 124 lb (56.2 kg)    SpO2 100%    BMI 22.68 kg/m  Wt Readings from Last 3 Encounters:  12/21/21 124 lb (56.2 kg)  11/24/21 128 lb 9.6 oz (58.3 kg)  09/16/21 130 lb 14.4 oz  (59.4 kg)    Health Maintenance Due  Topic Date Due   TETANUS/TDAP  Never done   COVID-19 Vaccine (3 - Booster for Pfizer series) 05/30/2020    There are no preventive care reminders to display for this patient.   Lab Results  Component Value Date   TSH 0.878 11/24/2021   Lab Results  Component Value Date   WBC 7.9 11/23/2020   HGB 13.2 11/23/2020   HCT 40.5 11/23/2020   MCV 87 11/23/2020   PLT 324 11/23/2020   Lab Results  Component Value Date   NA 139 11/24/2021   K 3.9 11/24/2021   CO2 21 11/24/2021   GLUCOSE 78 11/24/2021   BUN 9 11/24/2021   CREATININE 0.72 11/24/2021   BILITOT 0.3 11/24/2021   ALKPHOS 112 11/24/2021   AST 34 11/24/2021   ALT 45 (H) 11/24/2021   PROT 7.2 11/24/2021   ALBUMIN 4.5 11/24/2021   CALCIUM 8.8 11/24/2021   EGFR 114 11/24/2021   Lab Results  Component Value Date   CHOL 201 (H) 11/24/2021   Lab Results  Component Value Date   HDL 58 11/24/2021   Lab Results  Component Value Date   LDLCALC 130 (H) 11/24/2021   Lab Results  Component Value Date   TRIG 73 11/24/2021   Lab Results  Component Value Date   CHOLHDL 3.7 11/23/2020   Lab Results  Component Value Date   HGBA1C 5.1 11/27/2019       Assessment & Plan:   Problem List Items Addressed This Visit   None  Problem List Items Addressed This Visit       Other   Dilated pupil - Primary    Acute, new problem  Left pupil is unresponsive to light and dilated to approx 56m dilated Right pupil is sluggish CN intact at this time Patient denies vision changes, photophobia, tearing or discharge CT head to rule out stroke, trauma, potential causes of intracranial pressure       Other Visit Diagnoses     Neck pain, acute      Acute, new problem with left fixed pupil and sluggish right pupil reflex  Patient is recently recovering from COVID infection  Recommend CT scan of head and neck to rule out stroke, trauma, or potential mass causing increased intracranial  pressure.       Return in about 3 months (around 03/21/2022) for elevated liver enzymes, chronic follow up.   I, Denay Pleitez E Conley Pawling, PA-C, have reviewed all documentation for this visit. The documentation on 12/21/21 for the exam, diagnosis, procedures, and orders are all accurate and complete.   Hasnain Manheim, EGlennie IsleMPH BMang[facility] Park[facility]

## 2021-12-20 NOTE — Telephone Encounter (Signed)
°  Chief Complaint: numbness and tingling to back of head and neck (right side only) and ear pain is 6/10 Symptoms: stiff neck, see above, body pain, electrical shock pain 6/10 to right ear. Frequency: last week (Monday back of head and neck sx and 3 days later to right ear Pertinent Negatives: Patient denies dizziness, vision loss, double vision, changes in speech or unsteady on feet.  Disposition: [x] ED /[] Urgent Care (no appt availability in office) / [] Appointment(In office/virtual)/ []  [facility]/ [] Home Care/ [] Refused Recommended Disposition /[] [facility]/ []  Follow-up with PCP Additional Notes:  only right side, back of neck involved and right ear, cannot touch chin to chest Pt reluctant to go to ED was wanting UC.        Right side N x 1 week  Ear pain started Thursday 3 days laer Reason for Disposition  Headache  (and neurologic deficit)  Answer Assessment - Initial Assessment Questions 1. SYMPTOM: "What is the main symptom you are concerned about?" (e.g., weakness, numbness)     Numbness and tingling right back of head down to right side neck (continuous)- right ear: 6 back of 6 electric shock pain 2. ONSET: "When did this start?" (minutes, hours, days; while sleeping)     Last week 3. LAST NORMAL: "When was the last time you (the patient) were normal (no symptoms)?"     *No Answer* 4. PATTERN "Does this come and go, or has it been constant since it started?"  "Is it present now?"     Come and goes,  5. CARDIAC SYMPTOMS: "Have you had any of the following symptoms: chest pain, difficulty breathing, palpitations?"     no 6. NEUROLOGIC SYMPTOMS: "Have you had any of the following symptoms: headache, dizziness, vision loss, double vision, changes in speech, unsteady on your feet?"     Headache,unsteady  7. OTHER SYMPTOMS: "Do you have any other symptoms?"     Body pain 8. PREGNANCY: "Is there any chance you are pregnant?" "When was your last menstrual  period?"     *No Answer*  Protocols used: Neurologic Deficit-A-AH

## 2021-12-21 ENCOUNTER — Telehealth: Payer: Self-pay

## 2021-12-21 ENCOUNTER — Other Ambulatory Visit: Payer: Self-pay

## 2021-12-21 ENCOUNTER — Ambulatory Visit
Admission: RE | Admit: 2021-12-21 | Discharge: 2021-12-21 | Disposition: A | Discharge status: Home / Self Care | Payer: 59 | Source: Ambulatory Visit | Attending: Physician Assistant | Admitting: Physician Assistant

## 2021-12-21 ENCOUNTER — Ambulatory Visit (INDEPENDENT_AMBULATORY_CARE_PROVIDER_SITE_OTHER): Payer: 59 | Admitting: Physician Assistant

## 2021-12-21 VITALS — BP 103/69 | HR 103 | Temp 99.0°F | Wt 124.0 lb

## 2021-12-21 DIAGNOSIS — H5704 Mydriasis: Secondary | ICD-10-CM

## 2021-12-21 DIAGNOSIS — M542 Cervicalgia: Secondary | ICD-10-CM

## 2021-12-21 NOTE — Patient Instructions (Addendum)
Based on your symptoms I would like you to get a CT scan of your head  There are several things I want to rule out that can cause more serious issues if left unmanaged.  The referral team should call you to schedule this apt

## 2021-12-21 NOTE — Telephone Encounter (Signed)
Copied from [place] [id number]. Topic: General - Other >> Dec 21, 2021  4:50 PM Tessa Lerner A wrote: Reason for CRM: The patient has been seen at their eye doctor's office as directed by their PCP  The patient's eye doctor's office has directed the patient to request for referral information as well as additional information related to the patient's eye concerns please be sent to their office via fax   Please fax referral code to [phone]   Please contact the patient further if needed

## 2021-12-21 NOTE — Telephone Encounter (Signed)
Patient is being evaluated in office today by Junie Panning, [place]

## 2021-12-21 NOTE — Assessment & Plan Note (Signed)
Acute, new problem  Left pupil is unresponsive to light and dilated to approx 59mm dilated CN intact at this time Patient denies vision changes, photophobia, tearing or discharge CT head to rule out stroke, trauma, potential causes of intracranial pressure

## 2021-12-21 NOTE — Progress Notes (Signed)
Patient returned to office after CT scan was completed CT scan was negative  Recommend patient be evaluated by her ophthalmologist for potential ocular causes for dilated pupil

## 2021-12-22 ENCOUNTER — Telehealth: Payer: Self-pay

## 2021-12-22 DIAGNOSIS — H5704 Mydriasis: Secondary | ICD-10-CM

## 2021-12-22 NOTE — Telephone Encounter (Signed)
PT's husband called to report that [facility] do not address this problem. They now need a new referral for a location that will treat her eye situation. Left eye will not respond to light. Dilated Pupil

## 2021-12-22 NOTE — Telephone Encounter (Signed)
Copied from [place] [id number]. Topic: General - Other >> Dec 22, 2021  9:23 AM Yvette Rack wrote: Reason for CRM: Pt stated her eye doctor has not received any information and she would like to request that the records and information be sent to fax# [phone]

## 2021-12-22 NOTE — Telephone Encounter (Signed)
Referral placed this morning and information was given to Parke Poisson on which Dr's Office. Called patient and asked which Dr. She went to and she stated "My eye doctor"

## 2021-12-22 NOTE — Telephone Encounter (Signed)
Pt calling about referral, wants to get one now for [facility] becue [facility] doesn't have the equipment to use for her issue, with left eye knot responding to light. Please call back

## 2021-12-22 NOTE — Telephone Encounter (Signed)
Pt wanted to speak with the referral coordinator regarding the optometrist and having a referral sent into a [place] office, please advise.

## 2021-12-22 NOTE — Telephone Encounter (Signed)
Referral was sent to [facility] as per patient requested and office notes per Parke Poisson around 12pm. Not sure what else patient is needing. Please check with her.

## 2021-12-22 NOTE — Telephone Encounter (Signed)
Referral has been sent to [facility] as requested by pt Phone [phone] Pt has been advised

## 2021-12-22 NOTE — Addendum Note (Signed)
Addended by: Talitha Givens on: 12/22/2021 09:15 AM   Modules accepted: Orders

## 2021-12-22 NOTE — Addendum Note (Signed)
Addended by: Talitha Givens on: 12/22/2021 04:48 PM   Modules accepted: Orders

## 2021-12-23 NOTE — Telephone Encounter (Signed)
Pts eye insurance is through [employer]/ pts spouse called to let office know that '[facility]" could not treat pt/ I advised him that she needs referral to opthalmology and not optometry and that was being worked on / please advise when new referral has been placed

## 2021-12-24 ENCOUNTER — Telehealth: Payer: Self-pay | Admitting: Family Medicine

## 2021-12-24 DIAGNOSIS — H5704 Mydriasis: Secondary | ICD-10-CM

## 2021-12-24 NOTE — Telephone Encounter (Signed)
Pt went to [facility]. Pt is wanting Daneil Dan or Junie Panning to review the notes. Pt states that she has had headache for 2 weeks. Eye Doctor stated that the PCP could refer for further recommendations for MRI .  Please advise CB- [phone]

## 2021-12-28 NOTE — Telephone Encounter (Signed)
Advised patient that we will send a fax request to [facility] to receive records. KW

## 2021-12-28 NOTE — Telephone Encounter (Signed)
Patient called in to inform Tally Joe that she was seen by the eye Dr on Friday 12/24/21 and she is waiting to hear from PCP about what the next step will be. She is asking for a call back from office ASAP please call Ph#  [phone]

## 2021-12-28 NOTE — Telephone Encounter (Signed)
Patient was seen by Junie Panning 12/18/21 for dilated pupils. Please advise of plan of care. KW

## 2021-12-29 ENCOUNTER — Other Ambulatory Visit: Payer: Self-pay | Admitting: Endocrinology

## 2021-12-29 DIAGNOSIS — E041 Nontoxic single thyroid nodule: Secondary | ICD-10-CM

## 2021-12-30 NOTE — Telephone Encounter (Addendum)
Patient would like to know if PCP received her ophthalmologist report, patient was seen at the specialist office on 12/24/2021 and was advised report would be sent to PCP. Patient called on Monday to follow up and now calling back

## 2021-12-30 NOTE — Telephone Encounter (Signed)
Waiting on report for [facility] will notify patient. KW

## 2021-12-31 NOTE — Telephone Encounter (Signed)
Contacted patient back because we received notice from [facility] stating that patient has not been seen at there practice. I called patient and she states that she decided not to go to [place] and instead saw Dr. [facility] in [place]. KW

## 2022-01-10 ENCOUNTER — Other Ambulatory Visit: Payer: 59

## 2022-01-11 NOTE — Telephone Encounter (Signed)
Pt called saying the [facility] sent a fax twice to the office for the referral.  She said she is still has right ear and head pain.  She sent her for a CT scan and it was normal.  She wants to know if the office has received a fax yet.  She said she has called for three weeks and has not heard anything.    CB#  [phone]

## 2022-01-11 NOTE — Telephone Encounter (Signed)
Patient aware and husband. Per husband they want to know what is the next step for treatment. Patient is having headaches and neck pain and that he was told by the eye doctor that is not related to her eye issues. Scheduled patient 02/16 for 40 minutes.

## 2022-01-11 NOTE — Addendum Note (Signed)
Addended by: Doristine Devoid on: 01/11/2022 01:57 PM   Modules accepted: Orders

## 2022-01-12 ENCOUNTER — Ambulatory Visit
Admission: RE | Admit: 2022-01-12 | Discharge: 2022-01-12 | Disposition: A | Discharge status: Home / Self Care | Payer: 59 | Source: Ambulatory Visit | Attending: Endocrinology | Admitting: Endocrinology

## 2022-01-12 DIAGNOSIS — E041 Nontoxic single thyroid nodule: Secondary | ICD-10-CM

## 2022-01-13 ENCOUNTER — Other Ambulatory Visit: Payer: Self-pay

## 2022-01-13 ENCOUNTER — Encounter: Payer: Self-pay | Admitting: Physician Assistant

## 2022-01-13 ENCOUNTER — Ambulatory Visit (INDEPENDENT_AMBULATORY_CARE_PROVIDER_SITE_OTHER): Payer: 59 | Admitting: Physician Assistant

## 2022-01-13 VITALS — BP 104/70 | HR 94 | Temp 98.5°F | Resp 16 | Ht 62.0 in | Wt 123.6 lb

## 2022-01-13 DIAGNOSIS — R519 Headache, unspecified: Secondary | ICD-10-CM

## 2022-01-13 DIAGNOSIS — H9201 Otalgia, right ear: Secondary | ICD-10-CM | POA: Diagnosis not present

## 2022-01-13 MED ORDER — HYDROCORTISONE-ACETIC ACID 1-2 % OT SOLN: 2.0000 [drp] | Freq: Two times a day (BID) | OTIC | 0 refills | Status: AC

## 2022-01-13 NOTE — Patient Instructions (Addendum)
For your muscle tension and headaches I recommend the following:  Gentle stretches and maintenance of proper posture  Proper lifting techniques to reduce injury You can use gentle massage to the areas and use over the counter pain medications, Ibuprofen would be best.  You can look at the website [email or web address] for better patient information  I also like [facility] patient information sheets.   If these measures to not show relief in 4-6 weeks I would recommend a referral to Physical therapy.   Please apply the ear drops to your right ear twice per day to help with ear pain

## 2022-01-13 NOTE — Progress Notes (Signed)
Established patient visit   Patient: Robin Griffin   DOB: Jul 02, 1991   31 y.o. Female  MRN: [id number] Visit Date: 01/13/2022  Today's healthcare provider: Dani Gobble Earla Charlie, PA-C  Introduced myself to the patient as a Journalist, newspaper and provided education on APPs in clinical practice.    I,Joseline E Rosas,acting as a scribe for [employer], PA-C.,have documented all relevant documentation on the behalf of [place], PA-C,as directed by  [employer], PA-C while in the presence of Haja Crego E Kenlyn Lose, PA-C.   Chief Complaint  Patient presents with   Headache   Subjective    HPI  Patient here today with concerns of persistent headache and neck pain since last office visit. She was seen by the eye doctor for dilated pupils and was diagnosed with Adie's tonic pupil, per patient still having headache and wants further workup.  Husband is here to help with HPI States her pain is in the right occipital area  Pain is level 3/10 and is intermittent States she gets a tingling sensation in this area with pain, along with pulling sensation States forward flexion of neck seems to aggravate and cause pain but not continuous.   Right ear pain is occurring every day approx 3-5 times and lasts about 2 minutes- ear pain inside and behind the ear. 3/10 intensity They are worried about underlying causes. States her ear pain does not feel pressure related but does sometimes resolve with "popping ears" States she does have some jaw pain sometimes on the right side  She does have history of bruxism and uses a mouth guard Denies clenching or grinding during the day.  She does have some shoulder pain and tension in her shoulders  Husband states she does have a lot of health anxiety which has increased her tension She states she is not taking any OTC pain medications because headaches are not lasting long enough.     Starting at the beginning of the year she started trying to exercise with video regimens  doing yoga  States there was not good ergonomics with this  States the headaches began after this and infection with COVID    Medications: Outpatient Medications Prior to Visit  Medication Sig   levothyroxine (SYNTHROID) 25 MCG tablet Take 25 mcg by mouth every morning.   [DISCONTINUED] triamcinolone ointment (KENALOG) 0.5 % Apply 1 application topically 2 (two) times daily as needed. Pea size or smaller, discontinue use after 1 week.   No facility-administered medications prior to visit.    Review of Systems  Constitutional:  Negative for fatigue and fever.  HENT:  Positive for ear pain. Negative for ear discharge.   Eyes:  Negative for discharge and visual disturbance.  Musculoskeletal:  Positive for myalgias and neck pain.  Neurological:  Positive for headaches. Negative for dizziness, facial asymmetry, weakness, light-headedness and numbness.      Objective    BP 104/70 (BP Location: Left Arm, Patient Position: Sitting, Cuff Size: Normal)    Pulse 94    Temp 98.5 F (36.9 C) (Oral)    Resp 16    Ht 5\' 2"  (1.575 m)    Wt 123 lb 9.6 oz (56.1 kg)    BMI 22.61 kg/m  {Show previous vital signs (optional):23777}  Physical Exam Vitals reviewed.  Constitutional:      Appearance: Normal appearance. She is normal weight.  HENT:     Head: Normocephalic and atraumatic.     Right Ear: Hearing  and tympanic membrane normal. Tenderness present. There is no impacted cerumen.     Left Ear: Hearing, tympanic membrane, ear canal and external ear normal.     Ears:     Comments: Mild irritation and erythema noted in canal     Mouth/Throat:     Lips: Pink.     Mouth: Mucous membranes are moist.     Tongue: No lesions.     Pharynx: Oropharynx is clear. Uvula midline.  Eyes:     General: Lids are normal. Gaze aligned appropriately.     Extraocular Movements: Extraocular movements intact.     Conjunctiva/sclera: Conjunctivae normal.     Pupils: Pupils are unequal.     Right eye: Pupil is  not round and sluggish.     Left eye: Pupil is reactive and not sluggish.  Neck:     Trachea: Trachea and phonation normal.  Cardiovascular:     Rate and Rhythm: Normal rate and regular rhythm.     Heart sounds: Normal heart sounds.  Pulmonary:     Effort: Pulmonary effort is normal.     Breath sounds: Normal breath sounds. No wheezing, rhonchi or rales.  Musculoskeletal:     Right shoulder: No swelling, deformity or tenderness. Normal range of motion. Normal strength.     Left shoulder: No swelling, deformity or tenderness. Normal range of motion. Normal strength.     Cervical back: Normal, normal range of motion and neck supple. No edema, erythema or torticollis. No pain with movement. Normal range of motion.  Neurological:     General: No focal deficit present.     Mental Status: She is alert and oriented to person, place, and time. Mental status is at baseline.     Cranial Nerves: Cranial nerves 2-12 are intact.     Motor: Motor function is intact. No weakness or tremor.     Gait: Gait is intact.  Psychiatric:        Mood and Affect: Mood normal.        Behavior: Behavior normal.        Thought Content: Thought content normal.        Judgment: Judgment normal.      No results found for any visits on 01/13/22.  Assessment & Plan     Problem List Items Addressed This Visit   None Visit Diagnoses     Nonintractable headache, unspecified chronicity pattern, unspecified headache type    -  Primary Acute, new problem, intermittent and stable  Reviewed potential causes of intermittent headaches that could have etiology in following: TMJ dysfunction, jaw clenching, bruxism, tension headaches, position/posture related discomfort, sinus discomfort Suspect that her intermittent pains are likely muscular in nature given HPI Recommend gentle stretches, posture control, warm compresses, OTC ibuprofen, massage for management Recommend these conservative measures for at least 4 weeks to  help with episodic headaches before further management - to include referral to PT     Ear pain, right     Acute, new problem Exacerbated by wind and palpation/ ear exam with otoscope Suspect mild otitis externa from abrasions noted in canal Recommend using the prescribed ear drops for management at this time.  Follow up as needed.     Relevant Medications   acetic acid-hydrocortisone (VOSOL-HC) OTIC solution   Extensive amount of visit time spent reassuring patient and her husband of plan and overall health education as she is predisposed to health anxiety per her husband.     No follow-ups on  file.   I, [place] Malaia Buchta, PA-C, have reviewed all documentation for this visit. The documentation on 01/13/22 for the exam, diagnosis, procedures, and orders are all accurate and complete.   Sharnita Bogucki, [place] MPH [place], PA-C  [facility] [phone] (phone) [phone] (fax)  [place]

## 2022-03-19 IMAGING — US US THYROID
1 series · 13 of 25 positions shown · non-contrast
Comparison: 11/07/2019

CLINICAL DATA: Thyroid nodules, previous right thyroid biopsy

EXAM:
THYROID ULTRASOUND
TECHNIQUE: Ultrasound examination of the thyroid gland and adjacent soft
tissues was performed.

[Series 1: us thyroid · 0.08mm/px · 13 of 42 slices shown]
[im 1/42]
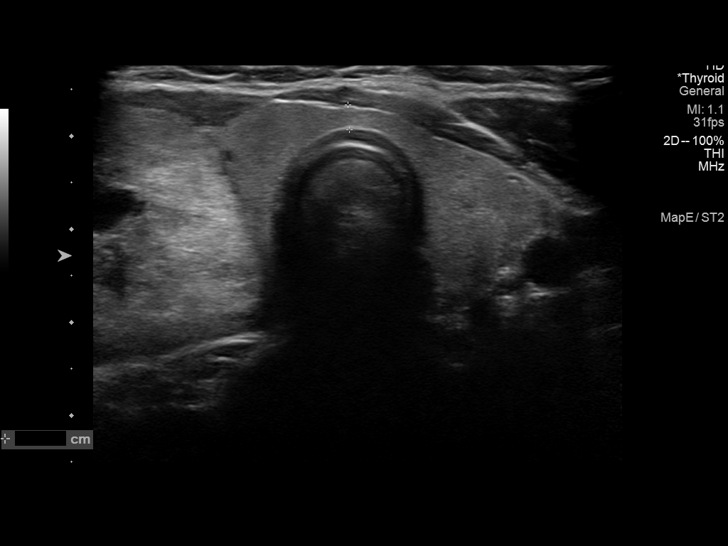
[im 4/42]
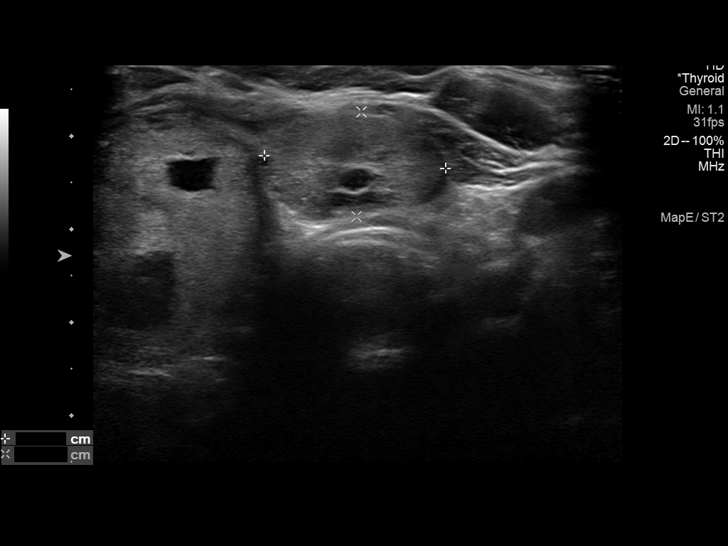
[im 7/42]
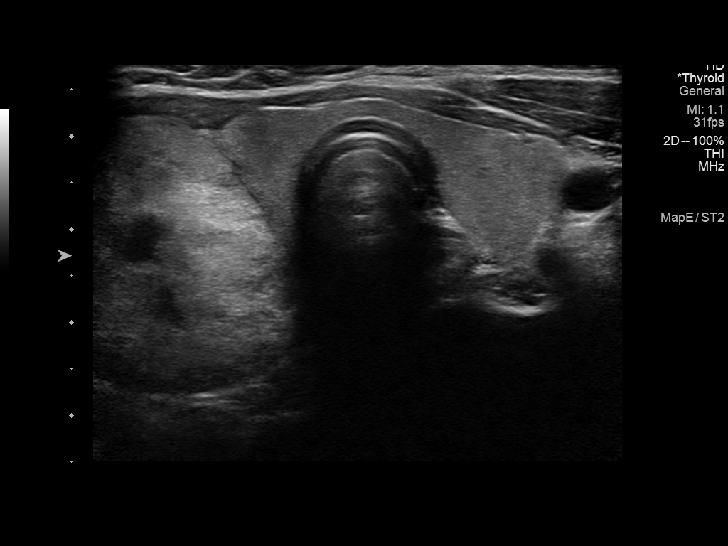
[im 11/42]
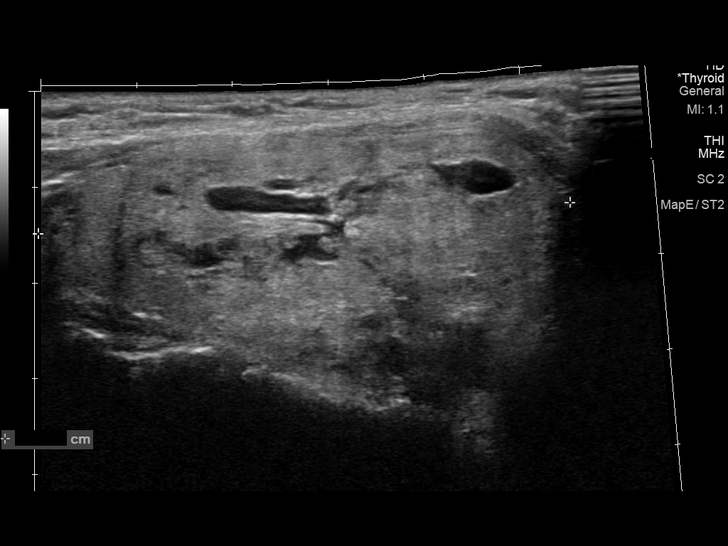
[im 14/42]
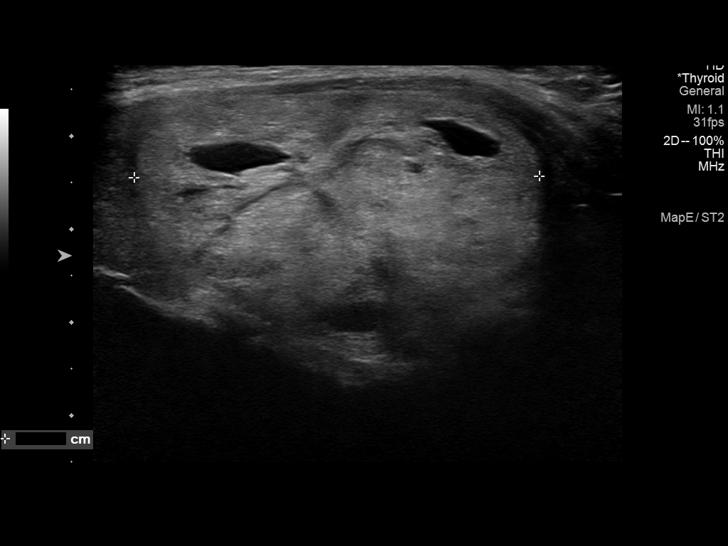
[im 18/42]
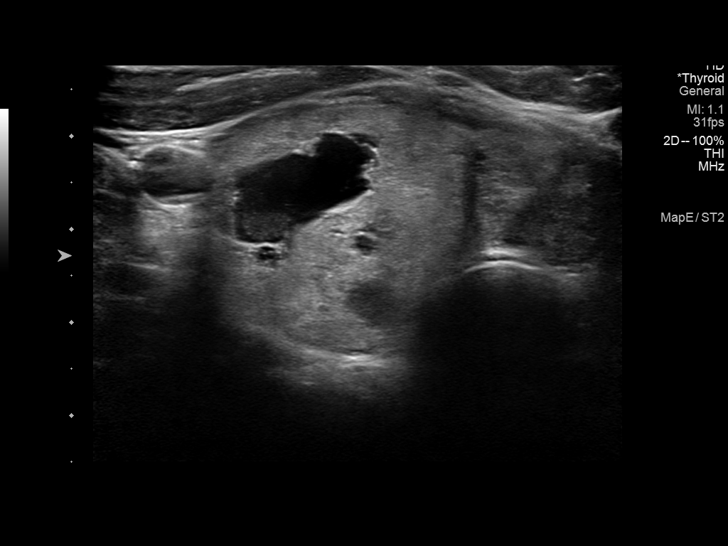
[im 21/42]
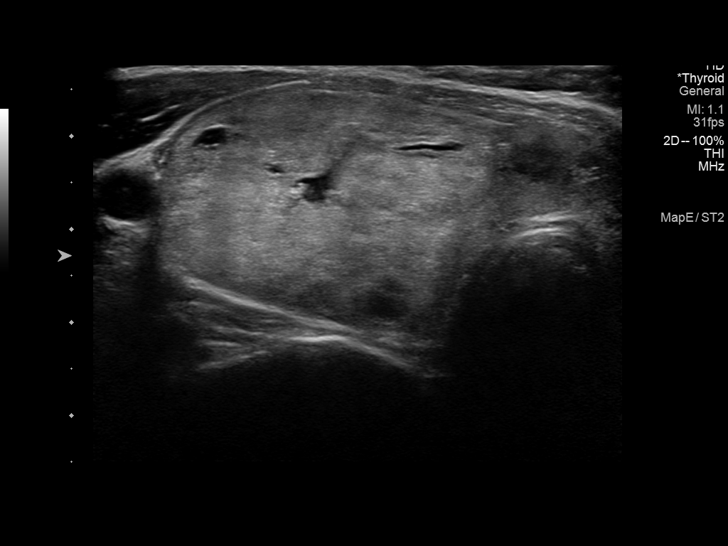
[im 24/42]
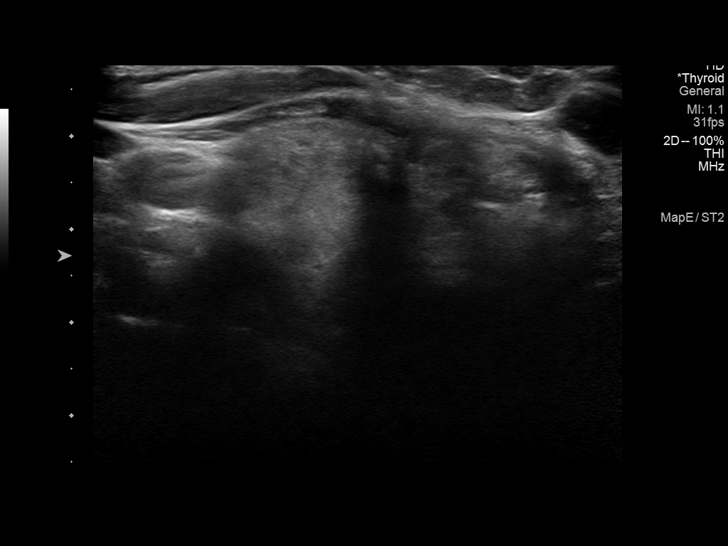
[im 28/42]
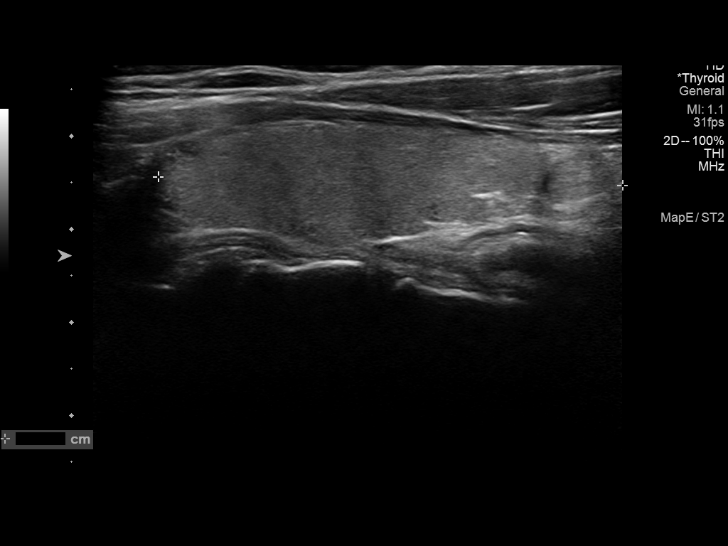
[im 31/42]
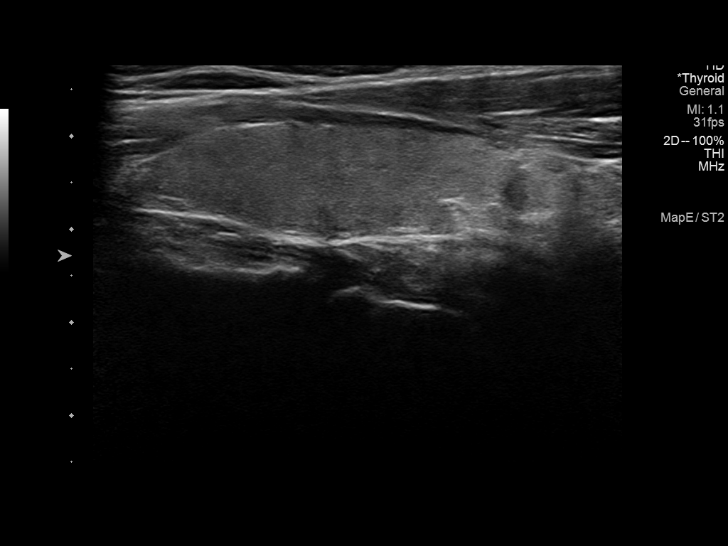
[im 35/42]
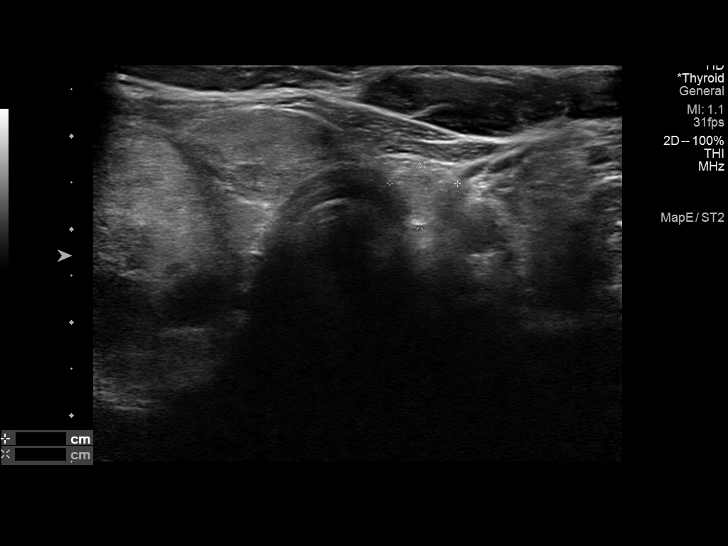
[im 38/42]
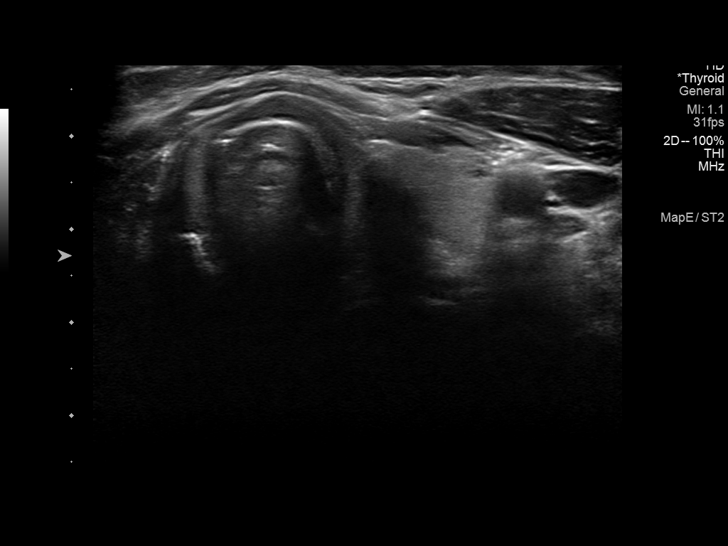
[im 42/42]
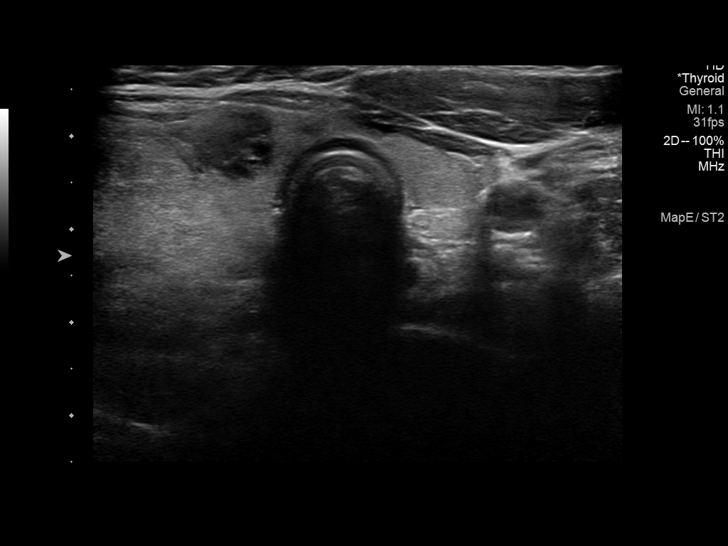

[13 of 25 positions shown; findings below may reference images not displayed]

FINDINGS: Parenchymal Echotexture: Mildly heterogenous

Isthmus: 3 mm

Right lobe: 5.5 x 2.7 x 4.5 cm

Left lobe: 5.0 x 1.4 x 1.4 cm

_________________________________________________________

Estimated total number of nodules >/= 1 cm: 3

Number of spongiform nodules >/=  2 cm not described below (TR1): 0

Number of mixed cystic and solid nodules >/= 1.5 cm not described
below (TR2): 1

_________________________________________________________

The large previously biopsied right thyroid nodule measures 4.4 x
2.5 x 3.4 cm, previously 4.7 x 2.3 x 3.1 cm. Similar appearance with
areas of cystic degeneration. Correlate with prior pathology.

Nodule # 1:

Location: Isthmus; Mid

Maximum size: 2.0, previously 1.8 cm; Other 2 dimensions: 1.1 x
cm

Composition: solid/almost completely solid (2)

Echogenicity: isoechoic (1)

Shape: not taller-than-wide (0)

Margins: smooth (0)

Echogenic foci: none (0)

ACR TI-RADS total points: 3.

ACR TI-RADS risk category: TR3 (3 points).

ACR TI-RADS recommendations:

*Given size (>/= 1.5 - 2.4 cm) and appearance, a follow-up
ultrasound in 1 year should be considered based on TI-RADS criteria.

_________________________________________________________

Also noted is a right isthmus 1.4 cm septated mixed cystic/solid
nodule and a left inferior thyroid 1 cm solid isoechoic nodule.
These nodules also would not meet criteria for any biopsy or
follow-up and are not fully described by TI rads criteria.

Normal vascularity.  No regional adenopathy.
IMPRESSION: 2 cm mid isthmus TR 3 nodule meets criteria follow-up in 1 year.

Stable previously biopsied right thyroid TR 3 type nodule. Correlate
with prior pathology.

No significant new thyroid abnormality or enlarging nodule.

The above is in keeping with the ACR TI-RADS recommendations - [HOSPITAL] 0630;[DATE].

## 2022-12-19 ENCOUNTER — Other Ambulatory Visit: Payer: Self-pay | Admitting: Endocrinology

## 2022-12-19 DIAGNOSIS — E041 Nontoxic single thyroid nodule: Secondary | ICD-10-CM

## 2023-01-31 ENCOUNTER — Ambulatory Visit
Admission: RE | Admit: 2023-01-31 | Discharge: 2023-01-31 | Disposition: A | Discharge status: Home / Self Care | Payer: Managed Care, Other (non HMO) | Source: Ambulatory Visit | Attending: Endocrinology | Admitting: Endocrinology

## 2023-01-31 DIAGNOSIS — E041 Nontoxic single thyroid nodule: Secondary | ICD-10-CM

## 2023-02-10 NOTE — Progress Notes (Signed)
Robin Battiest, MD sent to Robin Griffin PROCEDURE / BIOPSY REVIEW Date: 02/10/23  Requested Biopsy site: Inferior isthmus nodule Reason for request: per Endocrinologist Imaging review: Best seen on US thyroid from 01/30/23  Decision: Approved Imaging modality to perform: Ultrasound Schedule with: No sedation / Local anesthetic Schedule for: Any VIR  Additional comments: None  Please contact me with questions, concerns, or if issue pertaining to this request arise.  Robin Battiest, MD [facility]       Previous Messages    ----- Message ----- From: Robin Griffin Sent: 02/10/2023  12:32 PM EDT To: Robin Battiest, MD Subject: ATTN: Dr Gretta Cool   US Biopsy                  IR Approval Request:   Procedure:    US guided Inferior Isthmus Thyroid Nodule Biopsy  Reason:        enlarged Inferior Isthmus thyroid nodule  History:         US thyroid   01/31/2023  Provider:       Dr Jacelyn Pi, MD    Provider Contact #     [place]   [phone]

## 2023-02-13 ENCOUNTER — Other Ambulatory Visit: Payer: Self-pay | Admitting: Endocrinology

## 2023-02-13 DIAGNOSIS — E041 Nontoxic single thyroid nodule: Secondary | ICD-10-CM

## 2023-02-20 NOTE — Progress Notes (Signed)
Patient for US guided Thyroid Nodule Biopsy on Tues 02/21/2023, I called and spoke with the patient on the phone and gave pre-procedure instructions. Pt was made aware to be here at 12:30p and [facility] in at the [facility] registration desk. Pt stated understanding.  Called   02/20/2023

## 2023-02-21 ENCOUNTER — Ambulatory Visit
Admission: RE | Admit: 2023-02-21 | Discharge: 2023-02-21 | Disposition: A | Discharge status: Home / Self Care | Payer: Managed Care, Other (non HMO) | Source: Ambulatory Visit | Attending: Endocrinology | Admitting: Endocrinology

## 2023-02-21 DIAGNOSIS — E041 Nontoxic single thyroid nodule: Secondary | ICD-10-CM | POA: Diagnosis present

## 2023-02-21 MED ORDER — LIDOCAINE HCL (PF) 1 % IJ SOLN
5.0000 mL | Freq: Once | INTRAMUSCULAR | Status: AC
  Administered 2023-02-21: 5 mL via INTRADERMAL
  Filled 2023-02-21: qty 5

## 2023-02-22 LAB — CYTOLOGY - NON PAP

## 2023-12-25 ENCOUNTER — Other Ambulatory Visit: Payer: Self-pay | Admitting: Family Medicine

## 2023-12-25 DIAGNOSIS — N6313 Unspecified lump in the right breast, lower outer quadrant: Secondary | ICD-10-CM

## 2024-01-03 ENCOUNTER — Ambulatory Visit
Admission: RE | Admit: 2024-01-03 | Discharge: 2024-01-03 | Disposition: A | Discharge status: Home / Self Care | Payer: Managed Care, Other (non HMO) | Source: Ambulatory Visit | Attending: Family Medicine | Admitting: Family Medicine

## 2024-01-03 DIAGNOSIS — N6313 Unspecified lump in the right breast, lower outer quadrant: Secondary | ICD-10-CM | POA: Diagnosis present

## 2024-02-13 ENCOUNTER — Other Ambulatory Visit: Payer: Self-pay | Admitting: Endocrinology

## 2024-02-13 DIAGNOSIS — E041 Nontoxic single thyroid nodule: Secondary | ICD-10-CM

## 2024-02-14 ENCOUNTER — Encounter: Payer: Self-pay | Admitting: Endocrinology

## 2024-02-15 ENCOUNTER — Encounter: Payer: Self-pay | Admitting: Endocrinology

## 2024-02-19 ENCOUNTER — Ambulatory Visit
Admission: RE | Admit: 2024-02-19 | Discharge: 2024-02-19 | Disposition: A | Discharge status: Home / Self Care | Source: Ambulatory Visit | Attending: Endocrinology | Admitting: Endocrinology

## 2024-02-19 DIAGNOSIS — E041 Nontoxic single thyroid nodule: Secondary | ICD-10-CM
